# Patient Record
Sex: Male | Born: 1940 | Race: White | Hispanic: No | Marital: Married | Smoking: Former smoker
Health system: Southern US, Community
[De-identification: ages and names within clinical notes are randomized; demographics above are authoritative.]

## PROBLEM LIST (undated history)

## (undated) DIAGNOSIS — J449 Chronic obstructive pulmonary disease, unspecified: Secondary | ICD-10-CM

## (undated) DIAGNOSIS — I739 Peripheral vascular disease, unspecified: Secondary | ICD-10-CM

## (undated) DIAGNOSIS — K589 Irritable bowel syndrome without diarrhea: Secondary | ICD-10-CM

## (undated) DIAGNOSIS — I10 Essential (primary) hypertension: Secondary | ICD-10-CM

## (undated) DIAGNOSIS — E669 Obesity, unspecified: Secondary | ICD-10-CM

## (undated) DIAGNOSIS — E538 Deficiency of other specified B group vitamins: Secondary | ICD-10-CM

## (undated) DIAGNOSIS — R911 Solitary pulmonary nodule: Secondary | ICD-10-CM

## (undated) DIAGNOSIS — I5022 Chronic systolic (congestive) heart failure: Secondary | ICD-10-CM

## (undated) DIAGNOSIS — I119 Hypertensive heart disease without heart failure: Secondary | ICD-10-CM

## (undated) DIAGNOSIS — M1990 Unspecified osteoarthritis, unspecified site: Secondary | ICD-10-CM

## (undated) DIAGNOSIS — E7800 Pure hypercholesterolemia, unspecified: Secondary | ICD-10-CM

## (undated) DIAGNOSIS — K5190 Ulcerative colitis, unspecified, without complications: Secondary | ICD-10-CM

## (undated) DIAGNOSIS — E291 Testicular hypofunction: Secondary | ICD-10-CM

## (undated) HISTORY — DX: Deficiency of other specified B group vitamins: E53.8

## (undated) HISTORY — DX: Hypertensive heart disease without heart failure: I11.9

## (undated) HISTORY — DX: Ulcerative colitis, unspecified, without complications: K51.90

## (undated) HISTORY — DX: Chronic obstructive pulmonary disease, unspecified: J44.9

## (undated) HISTORY — DX: Testicular hypofunction: E29.1

## (undated) HISTORY — DX: Pure hypercholesterolemia, unspecified: E78.00

## (undated) HISTORY — DX: Chronic systolic (congestive) heart failure: I50.22

## (undated) HISTORY — DX: Essential (primary) hypertension: I10

## (undated) HISTORY — DX: Unspecified osteoarthritis, unspecified site: M19.90

## (undated) HISTORY — DX: Peripheral vascular disease, unspecified: I73.9

## (undated) HISTORY — DX: Obesity, unspecified: E66.9

## (undated) HISTORY — PX: AORTA - ILIAC ARTERY BYPASS GRAFT: SUR174

## (undated) HISTORY — DX: Solitary pulmonary nodule: R91.1

---

## 2007-08-21 ENCOUNTER — Ambulatory Visit: Payer: Self-pay | Admitting: Vascular Surgery

## 2008-02-13 ENCOUNTER — Ambulatory Visit: Payer: Self-pay | Admitting: Vascular Surgery

## 2008-08-19 ENCOUNTER — Ambulatory Visit: Payer: Self-pay | Admitting: Vascular Surgery

## 2009-02-09 ENCOUNTER — Ambulatory Visit: Payer: Self-pay | Admitting: Vascular Surgery

## 2009-07-21 ENCOUNTER — Ambulatory Visit: Payer: Self-pay | Admitting: Vascular Surgery

## 2009-08-11 ENCOUNTER — Ambulatory Visit (HOSPITAL_COMMUNITY): Admission: RE | Admit: 2009-08-11 | Discharge: 2009-08-11 | Payer: Self-pay | Admitting: Surgery

## 2009-08-11 ENCOUNTER — Ambulatory Visit: Payer: Self-pay | Admitting: Surgery

## 2009-09-08 ENCOUNTER — Ambulatory Visit: Payer: Self-pay | Admitting: Vascular Surgery

## 2010-03-23 ENCOUNTER — Ambulatory Visit: Payer: Self-pay | Admitting: Vascular Surgery

## 2010-08-16 LAB — POCT I-STAT, CHEM 8
BUN: 11 mg/dL (ref 6–23)
Calcium, Ion: 1.15 mmol/L (ref 1.12–1.32)
Chloride: 105 mEq/L (ref 96–112)
Creatinine, Ser: 0.8 mg/dL (ref 0.4–1.5)
Glucose, Bld: 112 mg/dL — ABNORMAL HIGH (ref 70–99)
HCT: 44 % (ref 39.0–52.0)
Hemoglobin: 15 g/dL (ref 13.0–17.0)
Potassium: 4 mEq/L (ref 3.5–5.1)
Sodium: 139 mEq/L (ref 135–145)
TCO2: 28 mmol/L (ref 0–100)

## 2010-09-07 ENCOUNTER — Encounter (INDEPENDENT_AMBULATORY_CARE_PROVIDER_SITE_OTHER): Payer: Medicare Other

## 2010-09-07 ENCOUNTER — Ambulatory Visit (INDEPENDENT_AMBULATORY_CARE_PROVIDER_SITE_OTHER): Payer: Medicare Other | Admitting: Vascular Surgery

## 2010-09-07 DIAGNOSIS — Z48812 Encounter for surgical aftercare following surgery on the circulatory system: Secondary | ICD-10-CM

## 2010-09-07 DIAGNOSIS — I70219 Atherosclerosis of native arteries of extremities with intermittent claudication, unspecified extremity: Secondary | ICD-10-CM

## 2010-09-07 DIAGNOSIS — I739 Peripheral vascular disease, unspecified: Secondary | ICD-10-CM

## 2010-09-07 NOTE — Assessment & Plan Note (Signed)
OFFICE VISIT  ETAI, COPADO A DOB:  1940/09/06                                       09/07/2010 ZOXWR#:[id number]  Patient returns today for continued follow-up regarding his lower extremity occlusive disease.  He had previously undergone bilateral iliac stenting as well as left femoral popliteal bypass grafting.  He has a known right superficial femoral occlusion.  He continues to have stable claudication beginning in the hips and buttock area, extending down into both thighs, left equal right, and occurs at about a half block and prevents him from walking more than 1/2 to 1 block.  He has no rest pain or history of nonhealing ulcers.  He states that his symptoms are tolerable.  CHRONIC MEDICAL PROBLEMS: 1. Hyperlipidemia. 2. Negative for diabetes, coronary artery disease, COPD, or stroke.  FAMILY HISTORY:  Negative for coronary artery disease, diabetes, and stroke.  SOCIAL HISTORY:  He quit smoking 15 years ago at the time of his left femoral-popliteal bypass graft.  Drinks occasional alcohol.  PHYSICAL EXAMINATION:  Blood pressure 176/71, heart rate 63, respirations 22.  General:  He is a well-developed and well-nourished male in no apparent distress, alert and oriented x3.  HEENT:  Normal for age.  EOMs intact.  Lungs:  Clear to auscultation.  No rhonchi or wheezing.  Cardiovascular:  Regular rhythm.  No murmurs.  Carotid pulses are 3+.  No audible bruits.  Abdomen:  Soft, nontender with no masses. Lower extremity exam reveals 3+ femoral and popliteal pulse on the left and a 3+ femoral pulse on the right.  Both feet are well perfused.  Today I ordered lower extremity duplex scan of his left femoral- popliteal graft and ABIs bilaterally, which I reviewed and interpreted. ABI on the right is 0.84.  On the left is 0.96 with biphasic flow bilaterally.  No evidence of stenosis in his femoral-popliteal graft.  I think in general he is doing well.   Will continue to follow him on a regular basis in the vascular lab unless he develops any worsening symptoms.    Quita Skye Hart [place], M.D. Electronically Signed  JDL/MEDQ  D:  09/07/2010  T:  09/07/2010  Job:  [id number]  cc:   Dr. Roney Marion

## 2010-09-14 NOTE — Procedures (Unsigned)
BYPASS GRAFT EVALUATION  INDICATION:  Follow up left bypass graft  HISTORY: Diabetes:  no Cardiac:  no Hypertension:  yes Smoking:  previous Previous Surgery:  Bilateral common iliac artery PTA and stenting 08/11/2009; left femoral popliteal bypass graft and common femoral artery DPA 10/28/1996.  SINGLE LEVEL ARTERIAL EXAM                              RIGHT              LEFT Brachial:                    186                182 Anterior tibial:             148                174 Posterior tibial:            156                179 Peroneal: Ankle/brachial index:        0.84               0.96  PREVIOUS ABI:  Date: 03/23/2010  RIGHT:  0.83  LEFT:  0.87  LOWER EXTREMITY BYPASS GRAFT DUPLEX EXAM:  DUPLEX:  Patent left femoral to popliteal bypass graft with elevated velocities noted at the proximal anastomosis of 234 cm/sec.  Increased velocity noted in the native outflow artery of 228 cm/sec.  IMPRESSION: 1. Biphasic wave forms noted throughout the patent left femoral to     popliteal bypass graft with elevated velocities as described above. 2. Stable right ankle brachial index. 3. Improved left ankle brachial index.  ___________________________________________ Quita Skye Hart [place], M.D.  EM/MEDQ  D:  09/08/2010  T:  09/08/2010  Job:  [id number]

## 2010-09-14 NOTE — Procedures (Unsigned)
AORTA-ILIAC DUPLEX EVALUATION  INDICATION:  Followup common iliac artery stenting  HISTORY: Diabetes:  no Cardiac:  no Hypertension:  yes Smoking:  Previous Previous Surgery:  Bilateral common iliac PTA and stenting done 08/11/2009              SINGLE LEVEL ARTERIAL EXAM                             RIGHT                  LEFT Brachial:                  186                    182 Anterior tibial:           148                    174 Posterior tibial:          156                    179 Peroneal: Ankle/brachial index:      0.84                   0.96 Previous ABI/date:         03/23/2010, 0.83       0.87  AORTA-ILIAC DUPLEX EXAM Aorta - Proximal     Not visualized due to bowel gas Aorta - Mid          Not visualized due to bowel gas Aorta - Distal       Not visualized due to bowel gas  RIGHT                                   LEFT 168 cm/s          CIA-PROXIMAL          Not visualized 170 cm/s          CIA-DISTAL            142 cm/s Not visualized    HYPOGASTRIC           Not visualized 134 cm/s          EIA-PROXIMAL          172 cm/s 146 cm/s          EIA-MID               161 cm/s Not visualized    EIA-DISTAL            158 cm/s  IMPRESSION: 1. Patent bilateral common iliac arteries. 2. Stable right ankle brachial indice. 3. Improved left ankle brachial index.  ___________________________________________ Quita Skye Hart [place], M.D.  EM/MEDQ  D:  09/08/2010  T:  09/08/2010  Job:  [id number]

## 2010-10-05 NOTE — Procedures (Signed)
BYPASS GRAFT EVALUATION   INDICATION:  Lower extremity bypass graft evaluation.   HISTORY:  Diabetes:  No  Cardiac:  No  Hypertension:  Yes  Smoking:  Quit in 1998.  Previous Surgery:  Left femoral popliteal bypass graft with nonreversed  saphenous vein and Dacron patch angioplasty of the left iliac and left  common femoral artery on 10/28/1996 by Dr. Hart [place].   SINGLE LEVEL ARTERIAL EXAM                               RIGHT              LEFT  Brachial:                    130                133  Anterior tibial:             82                 94  Posterior tibial:            99                 95  Peroneal:  Ankle/brachial index:        0.74               0.71   PREVIOUS ABI:  Date: 02/13/2008  RIGHT:  0.77  LEFT:  0.81   LOWER EXTREMITY BYPASS GRAFT DUPLEX EXAM:    Patent left femoral popliteal bypass graft with no evidence of  recurrent stenosis.   DUPLEX:  Biphasic Duplex wave form noted within graft and native  arteries.   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   ___________________________________________  Quita Skye. Hart [place], M.D.   AC/MEDQ  D:  08/19/2008  T:  08/20/2008  Job:  [id number]

## 2010-10-05 NOTE — Consult Note (Signed)
NEW PATIENT CONSULTATION   Derek Derek [name], Derek Derek [name]  DOB:  1940/12/10                                       07/21/2009  NWGNF#:[id number]   The patient is Derek [name] 70 year old male patient, known to me in the past,  having previously performed Derek [name] left femoral-popliteal bypass graft on him  in 1998 for severe claudication of the left leg.  He has done well over  the years and has been followed in the noninvasive vascular lab with  continued satisfactory ABIs in the 0.8 range bilaterally over the years.  He now reports that over the last several months he has developed  somewhat debilitating claudication in both lower extremities which  begins in both hips and extends into the thigh and calf areas.  He is  able to walk about 50-100 yards but he must then stop and rest, at which  point the symptoms are relieved.  He has no symptoms at rest and denies  any numbness in the feet or history of non-healing ulcers or infection.   CHRONIC MEDICAL PROBLEMS:  1. Hypotension.  2. Hyperlipidemia.  3. Negative for diabetes, coronary artery disease, COPD or stroke.   FAMILY HISTORY:  Negative for coronary artery disease, diabetes and  stroke.   SOCIAL HISTORY:  Married, has 4 children, is retired.  Quit smoking 14  years ago at the time of his femoral popliteal bypass.  Drinks  occasional alcohol.   REVIEW OF SYSTEMS:  Denies any weight gain, anorexia, chest pain,  dyspnea on exertion, PND, orthopnea, chronic bronchitis, wheezing.  No  GI or GU symptoms.  Does have lower extremity symptoms as noted in the  present illness.  No TIAs, amaurosis fugax, musculoskeletal symptoms.  All other systems are negative on review of systems.   PHYSICAL EXAMINATION:  Blood pressure 149/82, heart rate 69, temperature  98.  General:  He is Derek [name] well-developed, well-nourished male in no  apparent distress, appears to be younger than his stated age.  HEENT:  Exam is unremarkable.  EOMs intact.  Conjunctivae  normal.  Chest:  Clear  to auscultation.  No wheezing.  Cardiovascular:  Regular rhythm.  No  murmurs.  Carotid pulses 3+, no bruits.  Abdomen:  Soft, nontender with  no masses.  Musculoskeletal:  Free of major deformities.  Neurologic:  Normal with no weakness.  Skin:  Free of rashes.  Lower extremity exam  reveals 1-2+ femoral pulses bilaterally with popliteal and dorsalis  pedis pulses in both legs at 2+.  Both feet are well-perfused with no  infection or ulceration.  No evidence of venous disease.   Today, I ordered Derek [name] venous duplex exam and scan of the left lower  extremity bypass which I have reviewed and interpreted.  ABI is 0.83 on  the left and 0.78 on the right, essentially unchanged from previous  studies.  There is no evidence of increased velocity along the patent  left femoral-popliteal graft.   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  Will plan on aortobifemoral angiogram with  bilateral runoff by Dr. Myra Derek [name] on March 22 via the right femoral  approach to see if any lesions are amenable to angioplasty or stenting,  if this is something that would require  __________.     Derek Derek [name], M.D.  Electronically Signed   JDL/MEDQ  D:  07/21/2009  T:  07/22/2009  Job:  [id number]

## 2010-10-05 NOTE — Assessment & Plan Note (Signed)
OFFICE VISIT   Derek [name], VANDEGRIFT A  DOB:  05/09/1941                                       09/08/2009  ZOXWR#:[id number]   The patient returns for initial followup regarding the bilateral iliac  PTA and stenting procedure performed by Dr. Myra Gianotti March 22.  He was  having bilateral lower extremity claudication symptoms and was found to  have some occlusive disease at the origin of both common iliacs.  He has  a patent left femoral-popliteal bypass and an occluded right superficial  femoral artery.  He states that his walking has improved with less  burning and aching discomfort in the hips and thighs but he continues to  have a limit of about 500 to 1000 yards before he develops symptoms and  must stop and rest because of some discomfort in both calves.  Interestingly his right and left calf are equal in symptomatology.  He  denies any chest pain, dyspnea on exertion, chronic bronchitis,  hemoptysis, wheezing or other pulmonary issues.   Vital signs:  On exam today his blood pressure is 132/86, heart rate 65,  temperature 98.  General:  He is alert and oriented x3, well-developed  and well-nourished.  HEENT:  Exam is normal.  Chest:  Clear to  auscultation.  Abdomen:  Soft, nontender with no masses.  Lower  extremity exam reveals 3+ femoral pulses bilaterally, 2+ popliteal on  the left.  No popliteal on the right.  Both feet are well-perfused.   Today I ordered a lower extremity arterial study which I reviewed and  interpreted.  ABI is 0.77 on the right and the left is 0.92, slightly  improved from previous studies.   I reassured him regarding these findings and will continue to follow him  on the protocol on a regular basis unless his symptoms worsen.  We could  always consider right femoral-popliteal bypass grafting in the future if  his symptoms become more severe on the right side but at the present  time no indication to proceed with that.     Quita Skye Hart [place], M.D.  Electronically Signed   JDL/MEDQ  D:  09/08/2009  T:  09/09/2009  Job:  [id number]

## 2010-10-05 NOTE — Procedures (Signed)
BYPASS GRAFT EVALUATION   INDICATION:  Followup of a bypass graft.   HISTORY:  Diabetes:  no  Cardiac:  no  Hypertension:  yes  Smoking:  previous  Previous Surgery:  Left femoral to popliteal bypass graft, left iliac  and common femoral artery  DPA patch 10/28/1996.   SINGLE LEVEL ARTERIAL EXAM                               RIGHT              LEFT  Brachial:                    167                162  Anterior tibial:             104                133  Posterior tibial:            130                139  Peroneal:  Ankle/brachial index:        0.78               0.83   PREVIOUS ABI:  Date: 02/09/2009  RIGHT:  0.76  LEFT:  0.88   LOWER EXTREMITY BYPASS GRAFT DUPLEX EXAM:   DUPLEX:  Triphasic and biphasic Doppler waveforms throughout bypass  graft to native arteries   IMPRESSION:  1. Patent bypass graft.  No evidence of stenosis.  2. Ankle brachial indices remain stable from previous study.   ___________________________________________  Quita Skye Hart [place], M.D.   CJ/MEDQ  D:  07/21/2009  T:  07/21/2009  Job:  [id number]

## 2010-10-05 NOTE — Procedures (Signed)
BYPASS GRAFT EVALUATION   INDICATION:  Follow-up evaluation of lower extremity bypass graft.  Patient complains of bilateral claudication when walking up hill, left  worse than right.   HISTORY:  Diabetes:  No.  Cardiac:  No.  Hypertension:  Yes.  Smoking:  Quit in 1998.  Previous Surgery:  Left fem-pop bypass graft with nonreversed saphenous  vein and Dacron patch angioplasty of the left external iliac and left  common femoral artery on 10/28/96 by Dr. Hart [place].   SINGLE LEVEL ARTERIAL EXAM                               RIGHT              LEFT  Brachial:                    168                158  Anterior tibial:             98                 132  Posterior tibial:            128                136  Peroneal:  Ankle/brachial index:        0.77               0.81   PREVIOUS ABI:  Date: 08/21/07  RIGHT:  0.67  LEFT:  0.82   LOWER EXTREMITY BYPASS GRAFT DUPLEX EXAM:   DUPLEX:  1. Doppler arterial waveforms are biphasic proximal to, within, and      distal to the left fem-pop bypass graft.  2. No evidence of vein graft stenosis.   IMPRESSION:  1. Ankle brachial indices are stable from previous study bilaterally.  2. Patent left femoropopliteal bypass graft.   ___________________________________________  Quita Skye. Hart [place], M.D.   MC/MEDQ  D:  02/13/2008  T:  02/13/2008  Job:  [id number]

## 2010-10-05 NOTE — Procedures (Signed)
BYPASS GRAFT EVALUATION   INDICATION:  Follow up left leg bypass graft.   HISTORY:  Diabetes:  No.  Cardiac:  No.  Hypertension:  Yes.  Smoking:  Quit in 1998.  Previous Surgery:  Left femoral-to-popliteal artery bypass graft with  nonreversed saphenous vein and DPA of external iliac and common femoral  arteries on 10/28/1996 by Dr. Hart [place].   SINGLE LEVEL ARTERIAL EXAM                               RIGHT              LEFT  Brachial:                    158                150  Anterior tibial:             54                 130  Posterior tibial:            106                124  Peroneal:  Ankle/brachial index:        0.67               0.82   PREVIOUS ABI:  Date: 05/17/2006  RIGHT:  0.57  LEFT:  0.71   LOWER EXTREMITY BYPASS GRAFT DUPLEX EXAM:   DUPLEX:  Doppler arterial waveforms are biphasic proximal to, throughout  and distal to the graft.  Velocity is mildly elevated in proximal  anastomosis, distal anastomosis, common femoral and popliteal arteries.  These velocities are stable from previous exam.   IMPRESSION:  1. Patent left femoral-to-popliteal artery bypass graft.  2. ABIs are improved bilaterally.   ___________________________________________  Quita Skye Hart [place], M.D.   DP/MEDQ  D:  08/21/2007  T:  08/21/2007  Job:  [id number]

## 2010-10-05 NOTE — Procedures (Signed)
AORTA-ILIAC DUPLEX EVALUATION   INDICATION:   HISTORY:  Diabetes:  No.  Cardiac:  No.  Hypertension:  Yes.  Smoking:  Previous.  Previous Surgery:  Bilateral iliac PTA and stenting procedure done on  08/11/2009.               SINGLE LEVEL ARTERIAL EXAM                              RIGHT                  LEFT  Brachial:                  153                    151  Anterior tibial:           113                    139  Posterior tibial:          132                    133  Peroneal:  Ankle/brachial index:      0.83                   0.87  Previous ABI/date:         0.77, 09/08/2009       0.92, 09/08/2009   AORTA-ILIAC DUPLEX EXAM  Aorta - Proximal     Not visualized due to overlying bowel gas  Aorta - Mid          Not visualized due to overlying bowel gas  Aorta - Distal       Not visualized due to overlying bowel gas   RIGHT                                   LEFT  144 cm/s          CIA-PROXIMAL          187 cm/s  155 cm/s          CIA-DISTAL            218 cm/s  101 cm/s          HYPOGASTRIC           105 cm/s  158 cm/s          EIA-PROXIMAL          202 cm/s  158 cm/s          EIA-MID               184 cm/s  129 cm/s          EIA-DISTAL            121 cm/s   IMPRESSION:   ___________________________________________  Quita Skye Hart [place], M.D.   OD/MEDQ  D:  03/23/2010  T:  03/24/2010  Job:  [id number]

## 2010-10-05 NOTE — Procedures (Signed)
BYPASS GRAFT EVALUATION   INDICATION:  Left lower extremity bypass graft, with stable  claudication.   HISTORY:  Diabetes:  No.  Cardiac:  No.  Hypertension:  Yes.  Smoking:  Previous.  Previous Surgery:  Left femoral-popliteal artery bypass graft with DPA  of left iliac and left common femoral artery 10/28/1996 by Dr. Hart Rochester.   SINGLE LEVEL ARTERIAL EXAM                               RIGHT              LEFT  Brachial:                    148                143  Anterior tibial:             110                128  Posterior tibial:            112                130  Peroneal:  Ankle/brachial index:        0.76               0.88   PREVIOUS ABI:  Date: 08/19/2008  RIGHT:  0.74  LEFT:  0.71   LOWER EXTREMITY BYPASS GRAFT DUPLEX EXAM:   DUPLEX:  Doppler arterial waveforms appear biphasic proximal to, within  and distal to left lower extremity bypass graft.   IMPRESSION:  1. Patent left femoral-popliteal artery bypass graft.  2. Stable right ankle brachial index.  3. Left ankle brachial index shows increase from previous, correlating      more with study one year ago.   ___________________________________________  Quita Skye. Hart [place], M.D.   AS/MEDQ  D:  02/09/2009  T:  02/09/2009  Job:  [id number]

## 2011-09-08 ENCOUNTER — Encounter (INDEPENDENT_AMBULATORY_CARE_PROVIDER_SITE_OTHER): Payer: Medicare Other | Admitting: *Deleted

## 2011-09-08 ENCOUNTER — Ambulatory Visit (INDEPENDENT_AMBULATORY_CARE_PROVIDER_SITE_OTHER): Payer: Medicare Other | Admitting: *Deleted

## 2011-09-08 ENCOUNTER — Other Ambulatory Visit: Payer: Medicare Other | Admitting: *Deleted

## 2011-09-08 DIAGNOSIS — Z48812 Encounter for surgical aftercare following surgery on the circulatory system: Secondary | ICD-10-CM

## 2011-09-08 DIAGNOSIS — I739 Peripheral vascular disease, unspecified: Secondary | ICD-10-CM

## 2011-10-18 ENCOUNTER — Other Ambulatory Visit: Payer: Self-pay | Admitting: *Deleted

## 2011-10-18 ENCOUNTER — Encounter: Payer: Self-pay | Admitting: Vascular Surgery

## 2011-10-18 DIAGNOSIS — Z48812 Encounter for surgical aftercare following surgery on the circulatory system: Secondary | ICD-10-CM

## 2011-10-18 DIAGNOSIS — I70309 Unspecified atherosclerosis of unspecified type of bypass graft(s) of the extremities, unspecified extremity: Secondary | ICD-10-CM

## 2011-10-19 NOTE — Procedures (Unsigned)
BYPASS GRAFT EVALUATION  INDICATION:  Followup bypass graft.  HISTORY: Diabetes:  No Cardiac:  No Hypertension:  Yes Smoking:  Previously Previous Surgery:  Bilateral common iliac artery PTA and stenting, 08/11/2009; left femoral to popliteal bypass graft and DPA, 10/28/1996  SINGLE LEVEL ARTERIAL EXAM                              RIGHT              LEFT Brachial: Anterior tibial: Posterior tibial: Peroneal: Ankle/brachial index:        0.79               0.88  PREVIOUS ABI:  Date: 09/07/2010  RIGHT:  0.84  LEFT:  0.96  LOWER EXTREMITY BYPASS GRAFT DUPLEX EXAM:  DUPLEX:  Patent left fem-pop bypass graft with elevated velocity of 263 cm/second at the proximal anastomosis.  Increased velocity of 225 cm/second visualized at the distal anastomosis/native artery.  IMPRESSION: 1. Patent left femoral to popliteal bypass graft with velocities as     noted above. 2. Stable right ABI. 3. Stable left ABI in comparison to prior study 03/23/2010.  ___________________________________________ Quita Skye. Hart [place], M.D.  SS/MEDQ  D:  09/08/2011  T:  09/08/2011  Job:  [id number]

## 2012-09-03 ENCOUNTER — Encounter (INDEPENDENT_AMBULATORY_CARE_PROVIDER_SITE_OTHER): Payer: Medicare Other | Admitting: *Deleted

## 2012-09-03 ENCOUNTER — Other Ambulatory Visit: Payer: Medicare Other

## 2012-09-03 DIAGNOSIS — I70309 Unspecified atherosclerosis of unspecified type of bypass graft(s) of the extremities, unspecified extremity: Secondary | ICD-10-CM

## 2012-09-03 DIAGNOSIS — Z48812 Encounter for surgical aftercare following surgery on the circulatory system: Secondary | ICD-10-CM

## 2012-09-04 ENCOUNTER — Other Ambulatory Visit: Payer: Medicare Other

## 2012-09-04 ENCOUNTER — Ambulatory Visit: Payer: Medicare Other | Admitting: Neurosurgery

## 2012-09-12 ENCOUNTER — Encounter: Payer: Self-pay | Admitting: Vascular Surgery

## 2012-09-24 ENCOUNTER — Encounter: Payer: Self-pay | Admitting: Vascular Surgery

## 2012-09-25 ENCOUNTER — Encounter: Payer: Self-pay | Admitting: Vascular Surgery

## 2012-09-25 ENCOUNTER — Ambulatory Visit (INDEPENDENT_AMBULATORY_CARE_PROVIDER_SITE_OTHER): Payer: Medicare Other | Admitting: Vascular Surgery

## 2012-09-25 VITALS — BP 193/95 | HR 75 | Resp 18 | Ht 71.0 in | Wt 209.0 lb

## 2012-09-25 DIAGNOSIS — Z48812 Encounter for surgical aftercare following surgery on the circulatory system: Secondary | ICD-10-CM

## 2012-09-25 DIAGNOSIS — I70219 Atherosclerosis of native arteries of extremities with intermittent claudication, unspecified extremity: Secondary | ICD-10-CM

## 2012-09-25 NOTE — Progress Notes (Signed)
Subjective:     Patient ID: Derek [name], male   DOB: [birth year], 72 y.o.   MRN: [id number]  HPI this 72 year old male returns for continued followup regarding his left femoral popliteal bypass graft performed in 1998 and bilateral iliac stents performed by Dr. Myra Gianotti in 2011. He continues to have bilateral hip claudication after walking 3-4 blocks. He does not have severe calf claudication. His symptoms have not changed much. He denies any rest pain or history of nonhealing ulcers.  Past Medical History  Diagnosis Date  . Hypertension   . Claudication     History  Substance Use Topics  . Smoking status: Former Games developer  . Smokeless tobacco: Not on file  . Alcohol Use: 3.0 oz/week    5 Cans of beer per week    History reviewed. No pertinent family history.  Allergies  Allergen Reactions  . Ibuprofen Diarrhea    Current outpatient prescriptions:aspirin 81 MG tablet, Take 81 mg by mouth daily., Disp: , Rfl: ;  clopidogrel (PLAVIX) 75 MG tablet, , Disp: , Rfl: ;  lisinopril (PRINIVIL,ZESTRIL) 40 MG tablet, , Disp: , Rfl: ;  VYTORIN 10-20 MG per tablet, , Disp: , Rfl:   BP 193/95  Pulse 75  Resp 18  Ht 5\' 11"  (1.803 m)  Wt 209 lb (94.802 kg)  BMI 29.16 kg/m2  Body mass index is 29.16 kg/(m^2).         Review of Systems denies chest pain, dyspnea on exertion, PND, orthopnea, hemoptysis, lateralizing weakness, a fascia. All systems negative     Objective:   Physical Exam blood pressure 193/95 heart rate 75 respirations 18 Gen.-alert and oriented x3 in no apparent distress HEENT normal for age Lungs no rhonchi or wheezing Cardiovascular regular rhythm no murmurs carotid pulses 3+ palpable no bruits audible Abdomen soft nontender no palpable masses Musculoskeletal free of  major deformities Skin clear -no rashes Neurologic normal Lower extremities 2+ femoral pulses palpable bilaterally. Both feet well perfused. Pedal pulses not easily palpable.  Today I ordered  bilateral ABIs which were unchanged from a year ago with 0.82 on the right and 0.88 on the left. Also order a duplex scan of the left femoral popliteal bypass and iliac stents and there is no evidence of significant stenosis.      Assessment:     Doing well post bilateral iliac stents and 2011 left femoral-popliteal vein graft in 1998 with mild partially limiting hip claudication    Plan:     Return in one year for continued followup. If symptoms worsen and walking distance shortens will arrange for angiography to see if any restenosis and iliac stents has occurred

## 2012-09-25 NOTE — Addendum Note (Signed)
Addended by: Sharee Pimple on: 09/25/2012 09:32 AM   Modules accepted: Orders

## 2013-07-15 LAB — PULMONARY FUNCTION TEST

## 2013-09-24 ENCOUNTER — Encounter (HOSPITAL_COMMUNITY): Payer: Medicare Other

## 2013-09-24 ENCOUNTER — Ambulatory Visit: Payer: Medicare Other | Admitting: Vascular Surgery

## 2013-09-24 ENCOUNTER — Other Ambulatory Visit (HOSPITAL_COMMUNITY): Payer: Medicare Other

## 2013-10-21 ENCOUNTER — Encounter: Payer: Self-pay | Admitting: Critical Care Medicine

## 2013-10-22 ENCOUNTER — Encounter: Payer: Self-pay | Admitting: Critical Care Medicine

## 2013-10-22 ENCOUNTER — Ambulatory Visit (INDEPENDENT_AMBULATORY_CARE_PROVIDER_SITE_OTHER): Payer: Self-pay | Admitting: Critical Care Medicine

## 2013-10-22 VITALS — BP 126/72 | HR 84 | Temp 98.6°F | Ht 70.5 in | Wt 213.0 lb

## 2013-10-22 DIAGNOSIS — I739 Peripheral vascular disease, unspecified: Secondary | ICD-10-CM | POA: Insufficient documentation

## 2013-10-22 DIAGNOSIS — I10 Essential (primary) hypertension: Secondary | ICD-10-CM | POA: Insufficient documentation

## 2013-10-22 DIAGNOSIS — E7800 Pure hypercholesterolemia, unspecified: Secondary | ICD-10-CM | POA: Insufficient documentation

## 2013-10-22 DIAGNOSIS — R911 Solitary pulmonary nodule: Secondary | ICD-10-CM | POA: Insufficient documentation

## 2013-10-22 DIAGNOSIS — J4489 Other specified chronic obstructive pulmonary disease: Secondary | ICD-10-CM

## 2013-10-22 DIAGNOSIS — M1990 Unspecified osteoarthritis, unspecified site: Secondary | ICD-10-CM | POA: Insufficient documentation

## 2013-10-22 DIAGNOSIS — J449 Chronic obstructive pulmonary disease, unspecified: Secondary | ICD-10-CM

## 2013-10-22 MED ORDER — FLUTICASONE-SALMETEROL 250-50 MCG/DOSE IN AEPB: 1.0000 | INHALATION_SPRAY | Freq: Two times a day (BID) | RESPIRATORY_TRACT | Status: DC

## 2013-10-22 NOTE — Progress Notes (Signed)
Subjective:    Patient ID: Derek [name], male    DOB: Mar 15, 1941, 73 y.o.   MRN: [id number]  HPI Comments: Pt in hosp 3 mo ago with PNA. And ? Nodule R lung.  CT scan done .  Now has PAD, prior surgery.  Not able to walk 248ft and legs cramp and will get dyspneic  Shortness of Breath This is a chronic problem. The current episode started more than 1 year ago. The problem occurs daily (worse with an incline ). The problem has been unchanged. Associated symptoms include leg pain. Pertinent negatives include no abdominal pain, chest pain, claudication, fever, headaches, hemoptysis, leg swelling, neck pain, orthopnea, PND, rhinorrhea, sore throat, sputum production, swollen glands, syncope, vomiting or wheezing. Risk factors include smoking. Treatments tried: advair was Rx. uses as needed, ? if helps. The treatment provided no relief. His past medical history is significant for COPD and pneumonia. There is no history of asthma, bronchiolitis, CAD, DVT, a heart failure or PE.   Past Medical History  Diagnosis Date  . Hypertension   . Claudication   . Pulmonary nodule   . COPD (chronic obstructive pulmonary disease)   . Degenerative arthritis   . Obese   . PAD (peripheral artery disease)   . Pure hypercholesterolemia      Family History  Problem Relation Age of Onset  . Dementia Father   . Dementia Mother      History   Social History  . Marital Status: Married    Spouse Name: N/A    Number of Children: N/A  . Years of Education: N/A   Occupational History  . Retired    Social History Main Topics  . Smoking status: Former Smoker -- 2.50 packs/day for 46 years    Types: Cigarettes    Quit date: 05/23/1996  . Smokeless tobacco: Never Used  . Alcohol Use: 1.8 oz/week    3 Cans of beer per week  . Drug Use: No  . Sexual Activity: Not on file   Other Topics Concern  . Not on file   Social History Narrative  . No narrative on file     Allergies  Allergen Reactions  .  Ibuprofen Diarrhea     Outpatient Prescriptions Prior to Visit  Medication Sig Dispense Refill  . aspirin 81 MG tablet Take 81 mg by mouth daily.      . clopidogrel (PLAVIX) 75 MG tablet Take 75 mg by mouth daily.       Marland Kitchen VYTORIN 10-20 MG per tablet Take 1 tablet by mouth at bedtime.       Marland Kitchen lisinopril (PRINIVIL,ZESTRIL) 40 MG tablet        No facility-administered medications prior to visit.   Review of Systems  Constitutional: Negative for fever.  HENT: Negative for rhinorrhea, sinus pressure, sneezing, sore throat, trouble swallowing and voice change.   Respiratory: Positive for chest tightness and shortness of breath. Negative for cough, hemoptysis, sputum production, choking, wheezing and stridor.   Cardiovascular: Negative for chest pain, orthopnea, claudication, leg swelling, syncope and PND.  Gastrointestinal: Negative for vomiting and abdominal pain.       No gerd  Musculoskeletal: Negative for neck pain.  Neurological: Negative for headaches.       Objective:   Physical Exam Filed Vitals:   10/22/13 1528  BP: 126/72  Pulse: 84  Temp: 98.6 F (37 C)  TempSrc: Oral  Height: 5' 10.5" (1.791 m)  Weight: 213 lb (96.616 kg)  SpO2: 92%    Gen: Pleasant, well-nourished, in no distress,  normal affect  ENT: No lesions,  mouth clear,  oropharynx clear, no postnasal drip  Neck: No JVD, no TMG, no carotid bruits  Lungs: No use of accessory muscles, no dullness to percussion, distant bs  Cardiovascular: RRR, heart sounds normal, no murmur or gallops, no peripheral edema  Abdomen: soft and NT, no HSM,  BS normal  Musculoskeletal: No deformities, no cyanosis or clubbing  Neuro: alert, non focal  Skin: Warm, no lesions or rashes  No results found.   Cxr: copd changes Labs from [facility]: cmet, cbc OK     Assessment & Plan:   COPD (chronic obstructive pulmonary disease) Copd stable at present. Need old records  Plan Try to use Advair every day , one puff  twice daily We will obtain records from Dr Blenda Nicelyhodri Work on weight loss. Return 3 months    Updated Medication List Outpatient Encounter Prescriptions as of 10/22/2013  Medication Sig  . amLODipine (NORVASC) 5 MG tablet Take 1 tablet by mouth daily.  Marland Kitchen. aspirin 81 MG tablet Take 81 mg by mouth daily.  . clopidogrel (PLAVIX) 75 MG tablet Take 75 mg by mouth daily.   . Cyanocobalamin (VITAMIN B-12 IJ) Inject as directed every 30 (thirty) days.  . Fluticasone-Salmeterol (ADVAIR DISKUS) 250-50 MCG/DOSE AEPB Inhale 1 puff into the lungs 2 (two) times daily.  . hydrochlorothiazide (HYDRODIURIL) 12.5 MG tablet Take 1 tablet by mouth daily.  . Multiple Vitamin (MULTIVITAMIN) tablet Take 1 tablet by mouth daily.  Marland Kitchen. PROCTOSOL HC 2.5 % rectal cream as needed.  . Triamcinolone Acetonide 0.025 % LOTN as needed.  Marland Kitchen. VYTORIN 10-20 MG per tablet Take 1 tablet by mouth at bedtime.   . [DISCONTINUED] ADVAIR DISKUS 250-50 MCG/DOSE AEPB Inhale 1 puff into the lungs daily.  . [DISCONTINUED] lisinopril (PRINIVIL,ZESTRIL) 40 MG tablet

## 2013-10-22 NOTE — Patient Instructions (Signed)
Try to use Advair every day , one puff twice daily We will obtain records from Dr Blenda Nicely Work on weight loss. Return 3 months

## 2013-10-23 NOTE — Assessment & Plan Note (Signed)
Copd stable at present. Need old records  Plan Try to use Advair every day , one puff twice daily We will obtain records from Dr Blenda Nicely Work on weight loss. Return 3 months

## 2013-10-24 ENCOUNTER — Telehealth: Payer: Self-pay | Admitting: Critical Care Medicine

## 2013-10-24 NOTE — Telephone Encounter (Signed)
Rec'd from [facility] forward 9 pages to Dr.Wright

## 2013-10-25 ENCOUNTER — Encounter: Payer: Self-pay | Admitting: Critical Care Medicine

## 2013-10-29 ENCOUNTER — Other Ambulatory Visit (HOSPITAL_COMMUNITY): Payer: Medicare Other

## 2013-10-29 ENCOUNTER — Encounter (HOSPITAL_COMMUNITY): Payer: Medicare Other

## 2013-10-29 ENCOUNTER — Ambulatory Visit: Payer: Medicare Other | Admitting: Vascular Surgery

## 2013-11-13 ENCOUNTER — Telehealth: Payer: Self-pay | Admitting: Critical Care Medicine

## 2013-11-13 NOTE — Telephone Encounter (Signed)
Received 13 pages from [facility], sent to Dr. Delford Field on 11/13/13/ss.

## 2014-02-03 ENCOUNTER — Encounter: Payer: Self-pay | Admitting: Vascular Surgery

## 2014-02-04 ENCOUNTER — Ambulatory Visit (INDEPENDENT_AMBULATORY_CARE_PROVIDER_SITE_OTHER): Payer: Medicare Other | Admitting: Vascular Surgery

## 2014-02-04 ENCOUNTER — Encounter: Payer: Self-pay | Admitting: Vascular Surgery

## 2014-02-04 ENCOUNTER — Ambulatory Visit (INDEPENDENT_AMBULATORY_CARE_PROVIDER_SITE_OTHER)
Admission: RE | Admit: 2014-02-04 | Discharge: 2014-02-04 | Disposition: A | Discharge status: Home / Self Care | Payer: Medicare Other | Source: Ambulatory Visit | Attending: Vascular Surgery | Admitting: Vascular Surgery

## 2014-02-04 ENCOUNTER — Ambulatory Visit (HOSPITAL_COMMUNITY)
Admission: RE | Admit: 2014-02-04 | Discharge: 2014-02-04 | Disposition: A | Discharge status: Home / Self Care | Payer: Medicare Other | Source: Ambulatory Visit | Attending: Vascular Surgery | Admitting: Vascular Surgery

## 2014-02-04 VITALS — BP 141/91 | HR 98 | Resp 16 | Ht 70.0 in | Wt 206.0 lb

## 2014-02-04 DIAGNOSIS — I70219 Atherosclerosis of native arteries of extremities with intermittent claudication, unspecified extremity: Secondary | ICD-10-CM

## 2014-02-04 DIAGNOSIS — Z48812 Encounter for surgical aftercare following surgery on the circulatory system: Secondary | ICD-10-CM | POA: Diagnosis not present

## 2014-02-04 DIAGNOSIS — I739 Peripheral vascular disease, unspecified: Secondary | ICD-10-CM

## 2014-02-04 HISTORY — DX: Atherosclerosis of native arteries of extremities with intermittent claudication, unspecified extremity: I70.219

## 2014-02-04 NOTE — Progress Notes (Signed)
Subjective:     Patient ID: Derek [name], male   DOB: May 29, 1940, 73 y.o.   MRN: [id number]  HPI this 73 year old male returns for continued followup regarding his bilateral iliac stents placed by Dr. Myra Gianotti in 2011 and his left femoral-popliteal bypass graft-saphenous vein performed by me in 1998. He does have some bilateral buttock and thigh claudication symptoms but is able to ambulate long distances and this is not limiting him. His symptoms are equal in the right and left legs. He takes one aspirin per day.  Past Medical History  Diagnosis Date  . Hypertension   . Claudication   . Pulmonary nodule   . COPD (chronic obstructive pulmonary disease)   . Degenerative arthritis   . Obese   . PAD (peripheral artery disease)   . Pure hypercholesterolemia     History  Substance Use Topics  . Smoking status: Former Smoker -- 2.50 packs/day for 46 years    Types: Cigarettes    Quit date: 05/23/1996  . Smokeless tobacco: Never Used  . Alcohol Use: 1.8 oz/week    3 Cans of beer per week    Family History  Problem Relation Age of Onset  . Dementia Father   . Dementia Mother     Allergies  Allergen Reactions  . Ibuprofen Diarrhea    Current outpatient prescriptions:aspirin 81 MG tablet, Take 81 mg by mouth daily., Disp: , Rfl: ;  clopidogrel (PLAVIX) 75 MG tablet, Take 75 mg by mouth daily. , Disp: , Rfl: ;  Cyanocobalamin (VITAMIN B-12 IJ), Inject as directed every 30 (thirty) days., Disp: , Rfl: ;  Fluticasone-Salmeterol (ADVAIR DISKUS) 250-50 MCG/DOSE AEPB, Inhale 1 puff into the lungs 2 (two) times daily., Disp: 60 each, Rfl:  hydrochlorothiazide (HYDRODIURIL) 12.5 MG tablet, Take 1 tablet by mouth daily., Disp: , Rfl: ;  Multiple Vitamin (MULTIVITAMIN) tablet, Take 1 tablet by mouth daily., Disp: , Rfl: ;  PROCTOSOL HC 2.5 % rectal cream, as needed., Disp: , Rfl: ;  amLODipine (NORVASC) 5 MG tablet, Take 1 tablet by mouth daily., Disp: , Rfl: ;  Triamcinolone Acetonide 0.025 %  LOTN, as needed., Disp: , Rfl:  VYTORIN 10-20 MG per tablet, Take 1 tablet by mouth at bedtime. , Disp: , Rfl:   BP 141/91  Pulse 98  Resp 16  Ht  (1.778 m)  Wt 206 lb (93.441 kg)  BMI 29.56 kg/m2  Body mass index is 29.56 kg/(m^2).          Review of Systems does have dyspnea on exertion and leg pain with walking as well as some weakness in the arms and legs. Denies chest pain, orthopnea, hemoptysis, lateralizing weakness, a fascia. All other systems negative and a complete review of systems    Objective:   Physical Exam BP 141/91  Pulse 98  Resp 16  Ht  (1.778 m)  Wt 206 lb (93.441 kg)  BMI 29.56 kg/m2  Gen.-alert and oriented x3 in no apparent distress HEENT normal for age Lungs no rhonchi or wheezing Cardiovascular regular rhythm no murmurs carotid pulses 3+ palpable no bruits audible Abdomen soft nontender no palpable masses Musculoskeletal free of  major deformities Skin clear -no rashes Neurologic normal Lower extremities 2+ femoral and dorsalis pedis pulses palpable on the left.-2+ right femoral pulse with absent distal pulses. Both feet adequately perfused.  Today ordered duplex scan of both lower extremities and ABIs. Right leg has an ABI of 0.77 and the posterior tibial left leg has 0.89.  There is monophasic flow bilaterally. The right superficial femoral artery is occluded and the left femoral-popliteal vein graft is widely patent.       Assessment:     Patent bilateral iliac stents and left femoral-popliteal vein graft with mild bilateral hip claudication    Plan:     Return in one year for repeat duplex scan and ABIs. If symptoms worsen patient could undergo angiography. He may have developed some in-stent stenoses which would be amenable to balloon angioplasty. Symptoms are not limiting him at the present time.

## 2014-02-05 NOTE — Addendum Note (Signed)
Addended by: Sharee Pimple on: 02/05/2014 11:32 AM   Modules accepted: Orders

## 2014-03-11 ENCOUNTER — Ambulatory Visit (INDEPENDENT_AMBULATORY_CARE_PROVIDER_SITE_OTHER): Payer: Medicare Other | Admitting: Critical Care Medicine

## 2014-03-11 ENCOUNTER — Encounter: Payer: Self-pay | Admitting: Critical Care Medicine

## 2014-03-11 VITALS — BP 140/78 | HR 84 | Temp 97.8°F | Ht 70.0 in | Wt 205.2 lb

## 2014-03-11 DIAGNOSIS — J432 Centrilobular emphysema: Secondary | ICD-10-CM

## 2014-03-11 MED ORDER — FLUTICASONE-SALMETEROL 250-50 MCG/DOSE IN AEPB: 1.0000 | INHALATION_SPRAY | Freq: Two times a day (BID) | RESPIRATORY_TRACT | Status: DC

## 2014-03-11 NOTE — Assessment & Plan Note (Signed)
Copd Gold B stable at present Plan No change in inhaled or maintenance medications. Return in  4 months

## 2014-03-11 NOTE — Patient Instructions (Signed)
No change in medications. Return in         4 months 

## 2014-03-11 NOTE — Progress Notes (Signed)
Subjective:    Patient ID: Derek [name], male    DOB: March 22, 1941, 73 y.o.   MRN: [id number]  HPI Comments: Pt in hosp 3 mo ago with PNA. And ? Nodule R lung.  CT scan done .  Now has PAD, prior surgery.  Not able to walk 299ft and legs cramp and will get dyspneic  03/11/2014 Chief Complaint  Patient presents with  . Follow-up    Feeling the same-sob same with exertion,no cough,no wheeze,denies cp or tightness  Pt still with claudication up stairs or walk far. No changes on recent visit.  No real cough. Pt notes dyspnea up stairs or walk >154ft. Circulation limits more than dyspnea.  Pt in hosp 11/2013 for PNA.  Colonized with MRSA.  Hosp stay at [facility].  Pt denies any significant sore throat, nasal congestion or excess secretions, fever, chills, sweats, unintended weight loss, pleurtic or exertional chest pain, orthopnea PND, or leg swelling Pt denies any increase in rescue therapy over baseline, denies waking up needing it or having any early am or nocturnal exacerbations of coughing/wheezing/or dyspnea. Pt also denies any obvious fluctuation in symptoms with  weather or environmental change or other alleviating or aggravating factors    Review of Systems  HENT: Negative for sinus pressure, sneezing, trouble swallowing and voice change.   Respiratory: Positive for chest tightness. Negative for cough, choking and stridor.   Gastrointestinal:       No gerd       Objective:   Physical Exam  Filed Vitals:   03/11/14 1109  BP: 140/78  Pulse: 84  Temp: 97.8 F (36.6 C)  TempSrc: Oral  Height: 5\' 10"  (1.778 m)  Weight: 93.078 kg (205 lb 3.2 oz)  SpO2: 94%    Gen: Pleasant, well-nourished, in no distress,  normal affect  ENT: No lesions,  mouth clear,  oropharynx clear, no postnasal drip  Neck: No JVD, no TMG, no carotid bruits  Lungs: No use of accessory muscles, no dullness to percussion, distant bs  Cardiovascular: RRR, heart sounds normal, no murmur or gallops, no  peripheral edema  Abdomen: soft and NT, no HSM,  BS normal  Musculoskeletal: No deformities, no cyanosis or clubbing  Neuro: alert, non focal  Skin: Warm, no lesions or rashes  No results found.   Cxr: copd changes Labs from [facility]: cmet, cbc OK     Assessment & Plan:   COPD (chronic obstructive pulmonary disease) Gold B Copd Gold B stable at present Plan No change in inhaled or maintenance medications. Return in  4 months    Updated Medication List Outpatient Encounter Prescriptions as of 03/11/2014  Medication Sig  . amLODipine (NORVASC) 5 MG tablet Take 1 tablet by mouth daily.  Marland Kitchen aspirin 81 MG tablet Take 81 mg by mouth daily.  . clopidogrel (PLAVIX) 75 MG tablet Take 75 mg by mouth daily.   . Cyanocobalamin (VITAMIN B-12 IJ) Inject as directed every 30 (thirty) days.  . Fluticasone-Salmeterol (ADVAIR DISKUS) 250-50 MCG/DOSE AEPB Inhale 1 puff into the lungs 2 (two) times daily.  . hydrochlorothiazide (HYDRODIURIL) 12.5 MG tablet Take 1 tablet by mouth daily.  . Multiple Vitamin (MULTIVITAMIN) tablet Take 1 tablet by mouth daily.  Marland Kitchen PROCTOSOL HC 2.5 % rectal cream as needed.  . Triamcinolone Acetonide 0.025 % LOTN as needed.  . [DISCONTINUED] Fluticasone-Salmeterol (ADVAIR DISKUS) 250-50 MCG/DOSE AEPB Inhale 1 puff into the lungs 2 (two) times daily.  Marland Kitchen dicyclomine (BENTYL) 20 MG tablet As needed  . [  DISCONTINUED] terbinafine (LAMISIL) 250 MG tablet Take 250 mg by mouth daily.  . [DISCONTINUED] VYTORIN 10-20 MG per tablet Take 1 tablet by mouth at bedtime.

## 2014-06-26 DIAGNOSIS — K120 Recurrent oral aphthae: Secondary | ICD-10-CM | POA: Diagnosis not present

## 2014-06-26 DIAGNOSIS — H109 Unspecified conjunctivitis: Secondary | ICD-10-CM | POA: Diagnosis not present

## 2014-06-26 DIAGNOSIS — E538 Deficiency of other specified B group vitamins: Secondary | ICD-10-CM | POA: Diagnosis not present

## 2014-08-07 ENCOUNTER — Telehealth: Payer: Self-pay | Admitting: Critical Care Medicine

## 2014-08-07 DIAGNOSIS — R911 Solitary pulmonary nodule: Secondary | ICD-10-CM

## 2014-08-07 NOTE — Telephone Encounter (Signed)
Pt due for follow up CT Chest with no contrast around August 21, 2014 [email or web address] for pt on home and cell #s to remind him of above and see if he would like to have this done at [facility]. Order will need to be placed after speaking with pt.

## 2014-08-07 NOTE — Telephone Encounter (Signed)
Pt cb for crystal, sched f/u with PW on 3/29, pt wanted to know if he can get billed for his copay this day due to not getting his check until the third and PW next availble not being until 5/31. I called [facility] and asked if pt could be bill and they said they would have to ask PW when he comes back to office. Please cb at previous number listed

## 2014-08-08 NOTE — Telephone Encounter (Signed)
I sent Dahlia Bailiff office Manager, a generic email asking about co-pay question.  Will await response.  lmomtcb for pt on home and cell #s to discuss below regarding CT and let him know I am still working on the co-pay question with [facility] office as they do the billing.

## 2014-08-08 NOTE — Telephone Encounter (Signed)
Spoke with pt.  He would like to proceed with CT Chest - order placed to have scan done at [facility]. Pt aware he will receive a call from [facility] with appt date/time and is aware this will be done next wk as his appt is pending with PW on March 29.  Pt in agreement with this.  Regarding co-pay issue, this has been taken care of with [facility] office and pt aware.  Pt can keep March 29 appt with PW in [place] - pt aware and voiced no further questions or concerns at this time.

## 2014-08-13 DIAGNOSIS — I2510 Atherosclerotic heart disease of native coronary artery without angina pectoris: Secondary | ICD-10-CM | POA: Diagnosis not present

## 2014-08-13 DIAGNOSIS — R911 Solitary pulmonary nodule: Secondary | ICD-10-CM | POA: Diagnosis not present

## 2014-08-13 DIAGNOSIS — R918 Other nonspecific abnormal finding of lung field: Secondary | ICD-10-CM | POA: Diagnosis not present

## 2014-08-13 DIAGNOSIS — I358 Other nonrheumatic aortic valve disorders: Secondary | ICD-10-CM | POA: Diagnosis not present

## 2014-08-17 ENCOUNTER — Telehealth: Payer: Self-pay | Admitting: Critical Care Medicine

## 2014-08-17 DIAGNOSIS — R911 Solitary pulmonary nodule: Secondary | ICD-10-CM

## 2014-08-17 NOTE — Telephone Encounter (Signed)
Let pt know CT Chest is stable and all lung nodules are benign and unchanged since 2014.     No further CT scans needed

## 2014-08-18 NOTE — Telephone Encounter (Signed)
Pt returning Crystal's call - [phone]

## 2014-08-18 NOTE — Telephone Encounter (Signed)
Called and spoke to pt. Informed pt of the results and recs per PW. Pt verbalized understanding and denied any further questions or concerns at this time.

## 2014-08-18 NOTE — Telephone Encounter (Signed)
lmomtcb for pt 

## 2014-08-19 ENCOUNTER — Encounter: Payer: Self-pay | Admitting: Critical Care Medicine

## 2014-08-19 ENCOUNTER — Ambulatory Visit (INDEPENDENT_AMBULATORY_CARE_PROVIDER_SITE_OTHER): Payer: Medicaid Other | Admitting: Critical Care Medicine

## 2014-08-19 VITALS — BP 148/68 | HR 78 | Temp 97.1°F | Ht 70.0 in | Wt 209.6 lb

## 2014-08-19 DIAGNOSIS — J449 Chronic obstructive pulmonary disease, unspecified: Secondary | ICD-10-CM | POA: Diagnosis not present

## 2014-08-19 DIAGNOSIS — J432 Centrilobular emphysema: Secondary | ICD-10-CM | POA: Diagnosis not present

## 2014-08-19 NOTE — Patient Instructions (Signed)
No change in medications. Return in          6 months I will alert Dr redding to our conversation about you heart evaluation

## 2014-08-19 NOTE — Progress Notes (Signed)
   Subjective:    Patient ID: Derek [name], male    DOB: 02/08/1941, 74 y.o.   MRN: [id number]  HPI Comments: Pt in hosp 3 mo ago with PNA. And ? Nodule R lung.  CT scan done .  Now has PAD, prior surgery.  Not able to walk 262ft and legs cramp and will get dyspneic  08/19/2014 Chief Complaint  Patient presents with  . Follow-up    Had CT aware of results.Sob with exertion after about 100 yds.,no wheeze or cough,denies cp or tightness  F/u copd , on Advair.   CT Chest : Nodule unchanged.  Likely benign.  No further scans needed.    Pt still gets dyspneic with exertion. Pt with PAD.   Pt denies any significant sore throat, nasal congestion or excess secretions, fever, chills, sweats, unintended weight loss, pleurtic or exertional chest pain, orthopnea PND, or leg swelling Pt denies any increase in rescue therapy over baseline, denies waking up needing it or having any early am or nocturnal exacerbations of coughing/wheezing/or dyspnea. Pt also denies any obvious fluctuation in symptoms with  weather or environmental change or other alleviating or aggravating factors     Review of Systems  HENT: Negative for sinus pressure, sneezing, trouble swallowing and voice change.   Respiratory: Positive for chest tightness. Negative for cough, choking and stridor.   Gastrointestinal:       No gerd       Objective:   Physical Exam Filed Vitals:   08/19/14 1139  BP: 148/68  Pulse: 78  Temp: 97.1 F (36.2 C)  TempSrc: Oral  Height: 5\' 10"  (1.778 m)  Weight: 209 lb 9.6 oz (95.074 kg)  SpO2: 92%    Gen: Pleasant, well-nourished, in no distress,  normal affect  ENT: No lesions,  mouth clear,  oropharynx clear, no postnasal drip  Neck: No JVD, no TMG, no carotid bruits  Lungs: No use of accessory muscles, no dullness to percussion, distant bs  Cardiovascular: RRR, heart sounds normal, no murmur or gallops, no peripheral edema  Abdomen: soft and NT, no HSM,  BS  normal  Musculoskeletal: No deformities, no cyanosis or clubbing  Neuro: alert, non focal  Skin: Warm, no lesions or rashes  No results found.     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 amLODipine (NORVASC) 5 MG tablet Take 1 tablet by mouth daily.  Marland Kitchen aspirin 81 MG tablet Take 81 mg by mouth daily.  . clopidogrel (PLAVIX) 75 MG tablet Take 75 mg by mouth daily.   . Cyanocobalamin (VITAMIN B-12 IJ) Inject as directed every 30 (thirty) days.  . Fluticasone-Salmeterol (ADVAIR DISKUS) 250-50 MCG/DOSE AEPB Inhale 1 puff into the lungs 2 (two) times daily.  . hydrochlorothiazide (HYDRODIURIL) 12.5 MG tablet Take 1 tablet by mouth daily.  . Multiple Vitamin (MULTIVITAMIN) tablet Take 1 tablet by mouth daily.  Marland Kitchen PROCTOSOL HC 2.5 % rectal cream as needed.  . Triamcinolone Acetonide 0.025 % LOTN as needed.  . dicyclomine (BENTYL) 20 MG tablet As needed

## 2014-08-19 NOTE — Assessment & Plan Note (Signed)
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

## 2014-09-03 ENCOUNTER — Encounter: Payer: Self-pay | Admitting: Critical Care Medicine

## 2014-10-13 DIAGNOSIS — E538 Deficiency of other specified B group vitamins: Secondary | ICD-10-CM | POA: Diagnosis not present

## 2015-01-27 DIAGNOSIS — D510 Vitamin B12 deficiency anemia due to intrinsic factor deficiency: Secondary | ICD-10-CM | POA: Diagnosis not present

## 2015-02-03 DIAGNOSIS — L821 Other seborrheic keratosis: Secondary | ICD-10-CM | POA: Diagnosis not present

## 2015-02-03 DIAGNOSIS — R209 Unspecified disturbances of skin sensation: Secondary | ICD-10-CM | POA: Diagnosis not present

## 2015-02-03 DIAGNOSIS — L570 Actinic keratosis: Secondary | ICD-10-CM | POA: Diagnosis not present

## 2015-02-10 ENCOUNTER — Encounter (HOSPITAL_COMMUNITY): Payer: Medicaid Other

## 2015-02-10 ENCOUNTER — Other Ambulatory Visit (HOSPITAL_COMMUNITY): Payer: Medicaid Other

## 2015-02-10 ENCOUNTER — Ambulatory Visit: Payer: Medicare Other | Admitting: Family

## 2015-03-03 DIAGNOSIS — R05 Cough: Secondary | ICD-10-CM | POA: Diagnosis not present

## 2015-03-03 DIAGNOSIS — R911 Solitary pulmonary nodule: Secondary | ICD-10-CM | POA: Diagnosis not present

## 2015-03-03 DIAGNOSIS — J111 Influenza due to unidentified influenza virus with other respiratory manifestations: Secondary | ICD-10-CM | POA: Diagnosis not present

## 2015-03-03 DIAGNOSIS — R509 Fever, unspecified: Secondary | ICD-10-CM | POA: Diagnosis not present

## 2015-03-12 ENCOUNTER — Ambulatory Visit: Payer: Medicaid Other | Admitting: Internal Medicine

## 2015-03-26 DIAGNOSIS — Z86010 Personal history of colonic polyps: Secondary | ICD-10-CM | POA: Diagnosis not present

## 2015-03-26 DIAGNOSIS — K6289 Other specified diseases of anus and rectum: Secondary | ICD-10-CM | POA: Diagnosis not present

## 2015-03-26 DIAGNOSIS — K625 Hemorrhage of anus and rectum: Secondary | ICD-10-CM | POA: Diagnosis not present

## 2015-03-26 DIAGNOSIS — L290 Pruritus ani: Secondary | ICD-10-CM | POA: Diagnosis not present

## 2015-03-31 ENCOUNTER — Encounter: Payer: Self-pay | Admitting: Internal Medicine

## 2015-03-31 ENCOUNTER — Encounter: Payer: Self-pay | Admitting: *Deleted

## 2015-03-31 ENCOUNTER — Ambulatory Visit (INDEPENDENT_AMBULATORY_CARE_PROVIDER_SITE_OTHER)
Admission: RE | Admit: 2015-03-31 | Discharge: 2015-03-31 | Disposition: A | Discharge status: Home / Self Care | Payer: Medicare Other | Source: Ambulatory Visit | Attending: Internal Medicine | Admitting: Internal Medicine

## 2015-03-31 ENCOUNTER — Ambulatory Visit (INDEPENDENT_AMBULATORY_CARE_PROVIDER_SITE_OTHER): Payer: Medicare Other | Admitting: Internal Medicine

## 2015-03-31 VITALS — BP 138/74 | HR 82 | Ht 70.0 in | Wt 204.2 lb

## 2015-03-31 DIAGNOSIS — J449 Chronic obstructive pulmonary disease, unspecified: Secondary | ICD-10-CM | POA: Diagnosis not present

## 2015-03-31 DIAGNOSIS — R911 Solitary pulmonary nodule: Secondary | ICD-10-CM | POA: Diagnosis not present

## 2015-03-31 DIAGNOSIS — J189 Pneumonia, unspecified organism: Secondary | ICD-10-CM | POA: Diagnosis not present

## 2015-03-31 NOTE — Progress Notes (Signed)
Quick Note:  Spoke with pt and notified of results per Dr. Wert. Pt verbalized understanding and denied any questions.  ______ 

## 2015-03-31 NOTE — Progress Notes (Signed)
Quick Note:  lmtcb ______ 

## 2015-03-31 NOTE — Progress Notes (Signed)
Subjective:  Patient ID: Derek [name], male    DOB: July 18, 1940      MRN: [id number]   Brief patient profile:  18 yowm quit smoking around mid 1990s  followed previously by Dr Delford Field in [place] with copd with GOLD II criteria pfts  06/2013 / pvd    History of Present Illness  08/19/2014 Dr Lynelle Doctor final ov  Chief Complaint  Patient presents with  . Follow-up    Had CT aware of results.Sob with exertion after about 100 yds.,no wheeze or cough,denies cp or tightness  F/u copd , on Advair.   CT Chest : Nodule unchanged.  Likely benign.  No further scans needed.    Pt still gets dyspneic with exertion. Pt with PAD.   rec No change rx      03/31/2015  f/u ov/Koren Plyler re:  maint on advair 250 bid  Chief Complaint  Patient presents with  . Follow-up    Former PW pt. Pt reports breathing as been okay but still gets SOB w/ exertion. NO wheezing, no chest tx. Pt was DX w/ PNA 3 weeks ago. treated with ABX and prednisone   now back to baseline p rx for pna in   = chronic doe x MMRC2 = can't walk a nl pace on a flat grade s sob (no more than a few hundred yards at nl pace, no steps) s hips and leg pain = bilaterally  No obvious day to day or daytime variability or assoc chronic cough or cp or chest tightness, subjective wheeze or overt sinus or hb symptoms. No unusual exp hx or h/o childhood pna/ asthma or knowledge of premature birth.  Sleeping ok without nocturnal  or early am exacerbation  of respiratory  c/o's or need for noct saba. Also denies any obvious fluctuation of symptoms with weather or environmental changes or other aggravating or alleviating factors except as outlined above   Current Medications, Allergies, Complete Past Medical History, Past Surgical History, Family History, and Social History were reviewed in [employer] record.  ROS  The following are not active complaints unless bolded sore throat, dysphagia, dental problems, itching, sneezing,   nasal congestion or excess/ purulent secretions, ear ache,   fever, chills, sweats, unintended wt loss, classically pleuritic or exertional cp, hemoptysis,  orthopnea pnd or leg swelling, presyncope, palpitations, abdominal pain, anorexia, nausea, vomiting, diarrhea  or change in bowel or bladder habits, change in stools or urine, dysuria,hematuria,  rash, arthralgias, visual complaints, headache, numbness, weakness or ataxia or problems with walking or coordination,  change in mood/affect or memory.           Objective:   Physical Exam    Amb wm nad  Wt Readings from Last 3 Encounters:  03/31/15 204 lb 3.2 oz (92.625 kg)  08/19/14 209 lb 9.6 oz (95.074 kg)  03/11/14 205 lb 3.2 oz (93.078 kg)    Vital signs reviewed  Gen: Pleasant, well-nourished, in no distress,  normal affect  ENT: No lesions,  mouth clear,  oropharynx clear, no postnasal drip  One tooth left, full dentures   Neck: No JVD, no TMG, no carotid bruits  Lungs: No use of accessory muscles, no dullness to percussion, distant bs  Cardiovascular: RRR, heart sounds normal, no murmur or gallops, no peripheral edema  Abdomen: obese/ soft and NT, no HSM,  BS normal/ pos late insp hoover's   Musculoskeletal: No deformities, no cyanosis or clubbing  Neuro: alert, non focal  Skin: Warm,  no lesions or rashes  No results found.    CXR PA and Lateral:   03/31/2015 :    I personally reviewed images and agree with radiology impression as follows:   No active cardiopulmonary disease. Previously described pulmonary nodules on CT are too small to be characterized on radiographs.       Assessment & Plan:   Outpatient Encounter Prescriptions as of 03/31/2015  Medication Sig  . amLODipine (NORVASC) 5 MG tablet Take 1 tablet by mouth daily.  Marland Kitchen aspirin 81 MG tablet Take 81 mg by mouth daily.  . clopidogrel (PLAVIX) 75 MG tablet Take 75 mg by mouth daily.   . Cyanocobalamin (VITAMIN B-12 IJ) Inject as directed every 30  (thirty) days.  Marland Kitchen dicyclomine (BENTYL) 20 MG tablet As needed  . Fluticasone-Salmeterol (ADVAIR DISKUS) 250-50 MCG/DOSE AEPB Inhale 1 puff into the lungs 2 (two) times daily.  . hydrochlorothiazide (HYDRODIURIL) 12.5 MG tablet Take 1 tablet by mouth daily.  . Multiple Vitamin (MULTIVITAMIN) tablet Take 1 tablet by mouth daily.  . Triamcinolone Acetonide 0.025 % LOTN as needed.  . [DISCONTINUED] PROCTOSOL HC 2.5 % rectal cream as needed.   No facility-administered encounter medications on file as of 03/31/2015.

## 2015-03-31 NOTE — Patient Instructions (Addendum)
Please remember to go to the   x-ray department downstairs for your tests - we will call you with the results when they are available.   If you are satisfied with your treatment plan,  let your doctor know and he/she can either refill your medications or you can return here when your prescription runs out.     If in any way you are not 100% satisfied,  please tell us.  If 100% better, tell your friends!  Pulmonary follow up is as needed        

## 2015-04-05 ENCOUNTER — Encounter: Payer: Self-pay | Admitting: Internal Medicine

## 2015-04-05 NOTE — Assessment & Plan Note (Signed)
CT Chest 07/31/13: stable 64mm nodule RLL.  No change from 12/14. CT Chest 08/13/14: stable 57mm nodule RLL , unchanged from 2014 there fore is benign, no further scan needed  Pulmonary f/u can be prn

## 2015-04-05 NOTE — Assessment & Plan Note (Addendum)
Pfts 07/15/13:  FeV1 1.46 (56%)  59% ratio 68   RV/TLC 145%  dlco  59%> 66%   He barely meets the criteria for COPD based on his ratio and is very well compensated on Advair twice daily so there's no reason to change his regimen at this point. If he becomes about more limited in terms of desired activities that would not hesitate to add a LAMA  or even do a lama laba combination.   I had an extended  Final summary discussion with the patient reviewing all relevant studies completed to date and  lasting 15 to 20 minutes of a 25 minute visit    Each maintenance medication was reviewed in detail including most importantly the difference between maintenance and prns and under what circumstances the prns are to be triggered using an action plan format that is not reflected in the computer generated alphabetically organized AVS.    Please see instructions for details which were reviewed in writing and the patient given a copy highlighting the part that I personally wrote and discussed at today's ov.

## 2015-04-13 ENCOUNTER — Telehealth: Payer: Self-pay | Admitting: Internal Medicine

## 2015-04-13 MED ORDER — FLUTICASONE-SALMETEROL 250-50 MCG/DOSE IN AEPB: 1.0000 | INHALATION_SPRAY | Freq: Two times a day (BID) | RESPIRATORY_TRACT | Status: DC

## 2015-04-13 NOTE — Telephone Encounter (Signed)
RX for advair has been sent in. Nothing further needed

## 2015-04-22 DIAGNOSIS — D510 Vitamin B12 deficiency anemia due to intrinsic factor deficiency: Secondary | ICD-10-CM | POA: Diagnosis not present

## 2015-07-14 DIAGNOSIS — D510 Vitamin B12 deficiency anemia due to intrinsic factor deficiency: Secondary | ICD-10-CM | POA: Diagnosis not present

## 2015-08-05 DIAGNOSIS — H5203 Hypermetropia, bilateral: Secondary | ICD-10-CM | POA: Diagnosis not present

## 2015-08-26 DIAGNOSIS — D510 Vitamin B12 deficiency anemia due to intrinsic factor deficiency: Secondary | ICD-10-CM | POA: Diagnosis not present

## 2015-08-27 DIAGNOSIS — Z79899 Other long term (current) drug therapy: Secondary | ICD-10-CM | POA: Diagnosis not present

## 2015-08-27 DIAGNOSIS — E559 Vitamin D deficiency, unspecified: Secondary | ICD-10-CM | POA: Diagnosis not present

## 2015-08-27 DIAGNOSIS — R5381 Other malaise: Secondary | ICD-10-CM | POA: Diagnosis not present

## 2015-08-27 DIAGNOSIS — D510 Vitamin B12 deficiency anemia due to intrinsic factor deficiency: Secondary | ICD-10-CM | POA: Diagnosis not present

## 2015-08-27 DIAGNOSIS — E538 Deficiency of other specified B group vitamins: Secondary | ICD-10-CM | POA: Diagnosis not present

## 2015-08-27 DIAGNOSIS — I10 Essential (primary) hypertension: Secondary | ICD-10-CM | POA: Diagnosis not present

## 2015-11-02 DIAGNOSIS — E538 Deficiency of other specified B group vitamins: Secondary | ICD-10-CM | POA: Diagnosis not present

## 2015-12-02 DIAGNOSIS — D510 Vitamin B12 deficiency anemia due to intrinsic factor deficiency: Secondary | ICD-10-CM | POA: Diagnosis not present

## 2016-01-29 DIAGNOSIS — Z23 Encounter for immunization: Secondary | ICD-10-CM | POA: Diagnosis not present

## 2016-01-29 DIAGNOSIS — Z0000 Encounter for general adult medical examination without abnormal findings: Secondary | ICD-10-CM | POA: Diagnosis not present

## 2016-01-29 DIAGNOSIS — E538 Deficiency of other specified B group vitamins: Secondary | ICD-10-CM | POA: Diagnosis not present

## 2016-04-21 DIAGNOSIS — Z23 Encounter for immunization: Secondary | ICD-10-CM | POA: Diagnosis not present

## 2016-04-21 DIAGNOSIS — S41112A Laceration without foreign body of left upper arm, initial encounter: Secondary | ICD-10-CM | POA: Diagnosis not present

## 2016-04-21 DIAGNOSIS — E538 Deficiency of other specified B group vitamins: Secondary | ICD-10-CM | POA: Diagnosis not present

## 2016-05-05 ENCOUNTER — Other Ambulatory Visit: Payer: Self-pay | Admitting: Internal Medicine

## 2016-05-09 ENCOUNTER — Other Ambulatory Visit: Payer: Self-pay | Admitting: Internal Medicine

## 2016-06-03 DIAGNOSIS — D510 Vitamin B12 deficiency anemia due to intrinsic factor deficiency: Secondary | ICD-10-CM | POA: Diagnosis not present

## 2016-06-13 DIAGNOSIS — Z79899 Other long term (current) drug therapy: Secondary | ICD-10-CM | POA: Diagnosis not present

## 2016-06-13 DIAGNOSIS — I10 Essential (primary) hypertension: Secondary | ICD-10-CM | POA: Diagnosis not present

## 2016-06-13 DIAGNOSIS — E559 Vitamin D deficiency, unspecified: Secondary | ICD-10-CM | POA: Diagnosis not present

## 2016-06-13 DIAGNOSIS — Z9181 History of falling: Secondary | ICD-10-CM | POA: Diagnosis not present

## 2016-06-13 DIAGNOSIS — R5383 Other fatigue: Secondary | ICD-10-CM | POA: Diagnosis not present

## 2016-06-13 DIAGNOSIS — Z1389 Encounter for screening for other disorder: Secondary | ICD-10-CM | POA: Diagnosis not present

## 2016-06-13 DIAGNOSIS — L309 Dermatitis, unspecified: Secondary | ICD-10-CM | POA: Diagnosis not present

## 2016-06-13 DIAGNOSIS — E785 Hyperlipidemia, unspecified: Secondary | ICD-10-CM | POA: Diagnosis not present

## 2016-07-18 DIAGNOSIS — E538 Deficiency of other specified B group vitamins: Secondary | ICD-10-CM | POA: Diagnosis not present

## 2016-07-18 DIAGNOSIS — B9689 Other specified bacterial agents as the cause of diseases classified elsewhere: Secondary | ICD-10-CM | POA: Diagnosis not present

## 2016-07-18 DIAGNOSIS — R5381 Other malaise: Secondary | ICD-10-CM | POA: Diagnosis not present

## 2016-07-18 DIAGNOSIS — E291 Testicular hypofunction: Secondary | ICD-10-CM | POA: Diagnosis not present

## 2016-07-18 DIAGNOSIS — J208 Acute bronchitis due to other specified organisms: Secondary | ICD-10-CM | POA: Diagnosis not present

## 2016-07-19 DIAGNOSIS — R5383 Other fatigue: Secondary | ICD-10-CM | POA: Diagnosis not present

## 2016-07-19 DIAGNOSIS — E291 Testicular hypofunction: Secondary | ICD-10-CM | POA: Diagnosis not present

## 2016-08-16 DIAGNOSIS — R7989 Other specified abnormal findings of blood chemistry: Secondary | ICD-10-CM | POA: Diagnosis not present

## 2016-08-16 DIAGNOSIS — M542 Cervicalgia: Secondary | ICD-10-CM | POA: Diagnosis not present

## 2016-08-22 DIAGNOSIS — E7800 Pure hypercholesterolemia, unspecified: Secondary | ICD-10-CM | POA: Diagnosis not present

## 2016-08-22 DIAGNOSIS — Z0000 Encounter for general adult medical examination without abnormal findings: Secondary | ICD-10-CM | POA: Diagnosis not present

## 2016-08-22 DIAGNOSIS — E291 Testicular hypofunction: Secondary | ICD-10-CM | POA: Diagnosis not present

## 2016-08-22 DIAGNOSIS — R5383 Other fatigue: Secondary | ICD-10-CM | POA: Diagnosis not present

## 2016-08-22 DIAGNOSIS — I10 Essential (primary) hypertension: Secondary | ICD-10-CM | POA: Diagnosis not present

## 2016-08-22 DIAGNOSIS — E538 Deficiency of other specified B group vitamins: Secondary | ICD-10-CM | POA: Diagnosis not present

## 2016-09-28 DIAGNOSIS — D510 Vitamin B12 deficiency anemia due to intrinsic factor deficiency: Secondary | ICD-10-CM | POA: Diagnosis not present

## 2016-10-05 DIAGNOSIS — H6120 Impacted cerumen, unspecified ear: Secondary | ICD-10-CM | POA: Diagnosis not present

## 2016-10-06 DIAGNOSIS — L989 Disorder of the skin and subcutaneous tissue, unspecified: Secondary | ICD-10-CM | POA: Diagnosis not present

## 2016-10-06 DIAGNOSIS — L819 Disorder of pigmentation, unspecified: Secondary | ICD-10-CM | POA: Diagnosis not present

## 2016-10-06 DIAGNOSIS — L820 Inflamed seborrheic keratosis: Secondary | ICD-10-CM | POA: Diagnosis not present

## 2016-10-06 DIAGNOSIS — L821 Other seborrheic keratosis: Secondary | ICD-10-CM | POA: Diagnosis not present

## 2016-10-07 DIAGNOSIS — L821 Other seborrheic keratosis: Secondary | ICD-10-CM | POA: Diagnosis not present

## 2016-11-29 DIAGNOSIS — D510 Vitamin B12 deficiency anemia due to intrinsic factor deficiency: Secondary | ICD-10-CM | POA: Diagnosis not present

## 2017-01-11 DIAGNOSIS — D510 Vitamin B12 deficiency anemia due to intrinsic factor deficiency: Secondary | ICD-10-CM | POA: Diagnosis not present

## 2017-02-15 DIAGNOSIS — E538 Deficiency of other specified B group vitamins: Secondary | ICD-10-CM | POA: Diagnosis not present

## 2017-02-15 DIAGNOSIS — Z23 Encounter for immunization: Secondary | ICD-10-CM | POA: Diagnosis not present

## 2017-04-17 DIAGNOSIS — M79602 Pain in left arm: Secondary | ICD-10-CM | POA: Diagnosis not present

## 2017-04-17 DIAGNOSIS — E538 Deficiency of other specified B group vitamins: Secondary | ICD-10-CM | POA: Diagnosis not present

## 2017-06-27 DIAGNOSIS — E538 Deficiency of other specified B group vitamins: Secondary | ICD-10-CM | POA: Diagnosis not present

## 2017-08-04 ENCOUNTER — Other Ambulatory Visit: Payer: Self-pay

## 2017-08-04 NOTE — Patient Outreach (Signed)
[facility]) Care Management  08/04/2017  Derek [name] 06/30/1940 [id number]   Medication Adherence call to [name] patient is showing past due under [facility] Ins.on Atorvastatin 10 mg spoke with [name] he said he had call [facility] and they keep telling him he did not have refill on this medication and he need it to contact the doctors office I offer [name] to call [facility] and the doctors office so that he can received his medication he mention he was past due on this medication, [facility] said patient had refills and will fill  medication and have it ready along with other medication,call patient back informing him of what [facility] had said.   Lillia Abed CPhT Pharmacy Technician [facility] Management Direct Dial [phone]  Fax [phone] [email or web address]

## 2017-08-23 DIAGNOSIS — E785 Hyperlipidemia, unspecified: Secondary | ICD-10-CM | POA: Diagnosis not present

## 2017-08-23 DIAGNOSIS — Z23 Encounter for immunization: Secondary | ICD-10-CM | POA: Diagnosis not present

## 2017-08-23 DIAGNOSIS — Z0000 Encounter for general adult medical examination without abnormal findings: Secondary | ICD-10-CM | POA: Diagnosis not present

## 2017-08-23 DIAGNOSIS — Z1331 Encounter for screening for depression: Secondary | ICD-10-CM | POA: Diagnosis not present

## 2017-08-23 DIAGNOSIS — Z9181 History of falling: Secondary | ICD-10-CM | POA: Diagnosis not present

## 2017-08-23 DIAGNOSIS — Z79899 Other long term (current) drug therapy: Secondary | ICD-10-CM | POA: Diagnosis not present

## 2017-08-23 DIAGNOSIS — D519 Vitamin B12 deficiency anemia, unspecified: Secondary | ICD-10-CM | POA: Diagnosis not present

## 2017-08-23 DIAGNOSIS — Z1339 Encounter for screening examination for other mental health and behavioral disorders: Secondary | ICD-10-CM | POA: Diagnosis not present

## 2017-08-28 DIAGNOSIS — R6889 Other general symptoms and signs: Secondary | ICD-10-CM | POA: Diagnosis not present

## 2017-09-09 IMAGING — DX DG CHEST 2V
2 series · 2 of 2 positions shown · non-contrast
Comparison: Chest CT 08/13/2014

CLINICAL DATA: Follow-up pneumonia. No complains. History of
pulmonary nodules

EXAM:
CHEST  2 VIEW

[chest pa]
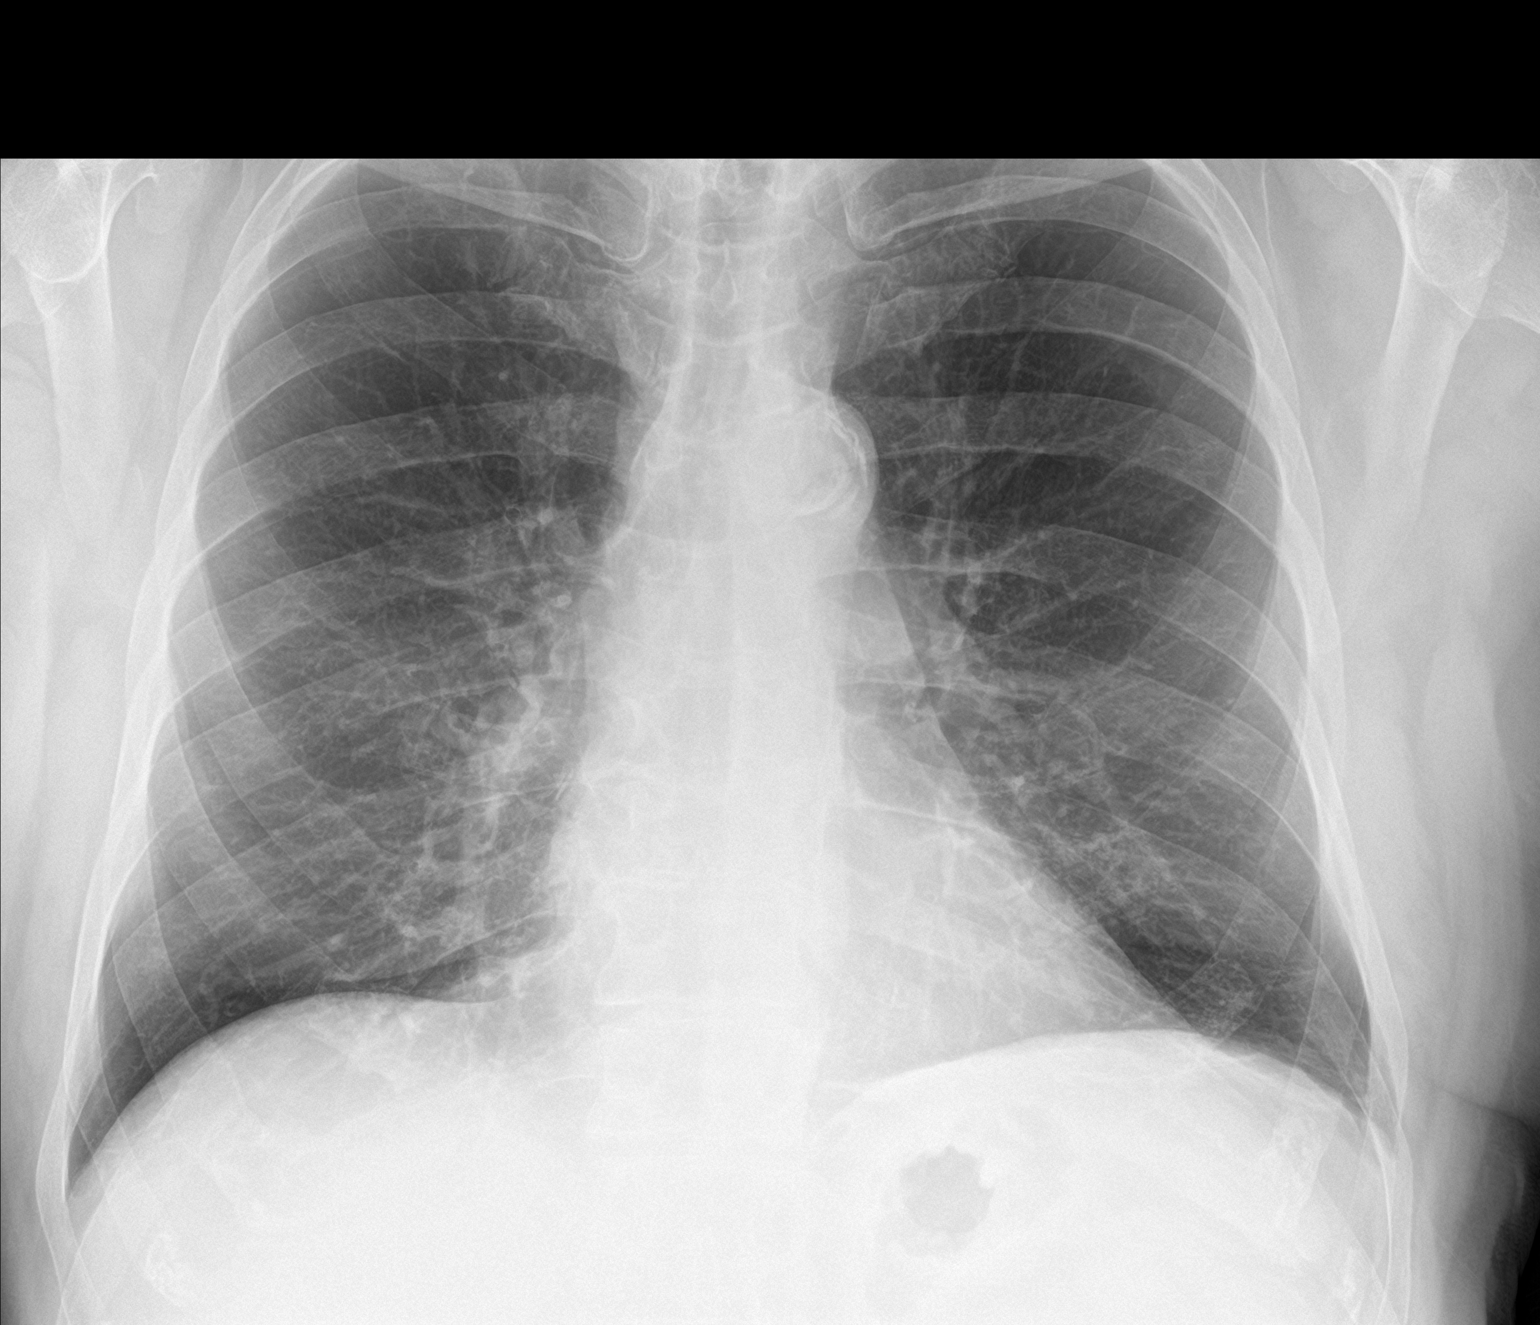

[chest lat]
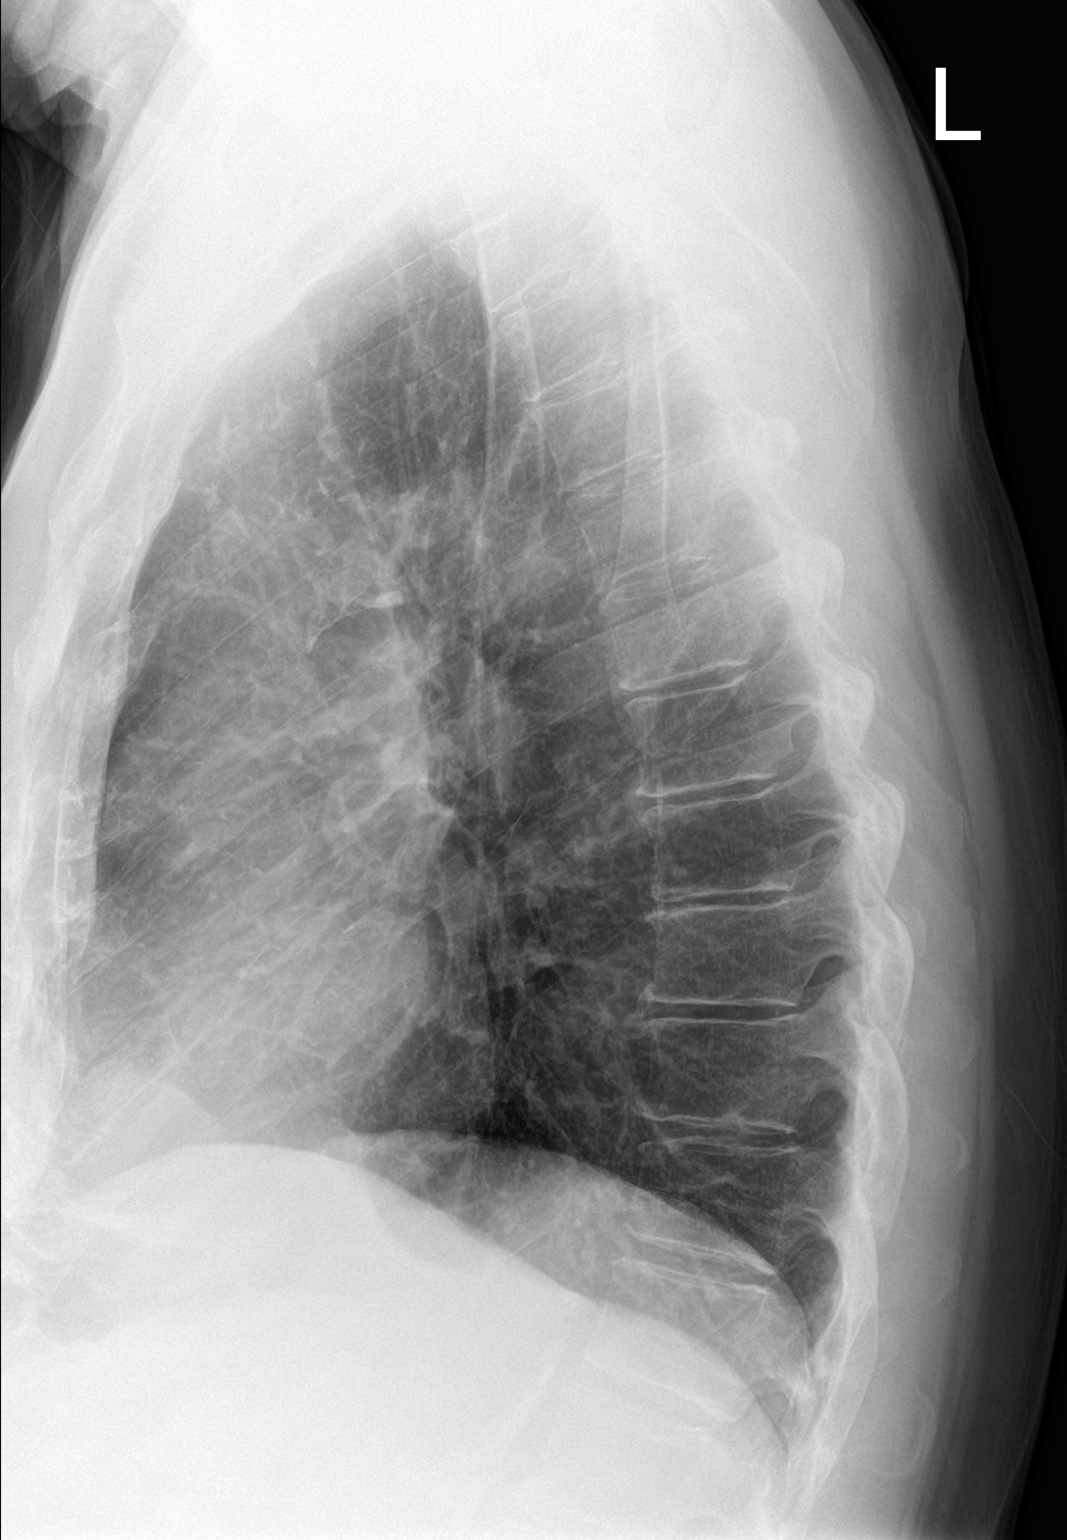

[2 of 2 positions shown; findings below may reference images not displayed]

FINDINGS: Both lungs are clear. There is no evidence for airspace disease or
pulmonary edema. Heart and mediastinum are within normal limits.
Atherosclerotic calcifications at the aortic arch. Trachea is
midline. No pleural effusions.
IMPRESSION: No active cardiopulmonary disease.

Previously described pulmonary nodules on CT are too small to be
characterized on radiographs.

## 2017-09-27 DIAGNOSIS — D510 Vitamin B12 deficiency anemia due to intrinsic factor deficiency: Secondary | ICD-10-CM | POA: Diagnosis not present

## 2017-10-06 ENCOUNTER — Other Ambulatory Visit: Payer: Self-pay | Admitting: *Deleted

## 2017-10-06 ENCOUNTER — Encounter: Payer: Self-pay | Admitting: Vascular Surgery

## 2017-10-06 ENCOUNTER — Other Ambulatory Visit: Payer: Self-pay

## 2017-10-06 ENCOUNTER — Encounter: Payer: Self-pay | Admitting: *Deleted

## 2017-10-06 ENCOUNTER — Ambulatory Visit (INDEPENDENT_AMBULATORY_CARE_PROVIDER_SITE_OTHER): Payer: Medicare Other | Admitting: Vascular Surgery

## 2017-10-06 VITALS — Ht 70.0 in | Wt 207.0 lb

## 2017-10-06 DIAGNOSIS — I739 Peripheral vascular disease, unspecified: Secondary | ICD-10-CM | POA: Diagnosis not present

## 2017-10-06 NOTE — Progress Notes (Signed)
Patient ID: Derek [name], male   DOB: 09-23-1940, 77 y.o.   MRN: [id number]  Reason for Consult: Claudication (Abnormal ABI's.  Last seen by [facility] 2015.  Bilat leg cramps after walking for a while.)   Referred by Noni Saupe, MD  Subjective:     HPI:  Derek [name] is a 77 y.o. male with a history of a left femoral to popliteal artery bypass graft with vein in 1998.  He subsequently had bilateral iliac artery stents placed by Dr. Elpidio Anis in 2011.  Last visit in 2015 he was having some claudication-like symptoms with cramping in his calves but there is no indication for procedure.  He has recently undergone outside the ABIs.  This was done for persistent cramping from his buttocks down to his calves with very short distance walking.  He is unable to walk up any hills at all without having pain.  He has difficulty getting to the mailbox and has difficulty getting from the cart to his golf ball while golfing.  He is a former smoker.  He continues take aspirin Plavix.  He denies tissue loss or ulceration.  Past Medical History:  Diagnosis Date  . Claudication (HCC)   . COPD (chronic obstructive pulmonary disease) (HCC)   . Degenerative arthritis   . Hypertension   . Obese   . PAD (peripheral artery disease) (HCC)   . Pulmonary nodule   . Pure hypercholesterolemia    Family History  Problem Relation Age of Onset  . Dementia Father   . Dementia Mother    Past Surgical History:  Procedure Laterality Date  . AORTA - ILIAC ARTERY BYPASS GRAFT      Short Social History:  Social History   Tobacco Use  . Smoking status: Former Smoker    Packs/day: 2.50    Years: 46.00    Pack years: 115.00    Types: Cigarettes    Last attempt to quit: 05/23/1996    Years since quitting: 21.3  . Smokeless tobacco: Never Used  Substance Use Topics  . Alcohol use: Yes    Alcohol/week: 1.8 oz    Types: 3 Cans of beer per week    Allergies  Allergen Reactions  . Aleve [Naproxen Sodium]     Due to blood pressure  . Ibuprofen Diarrhea    Current Outpatient Medications  Medication Sig Dispense Refill  . amLODipine (NORVASC) 5 MG tablet Take 1 tablet by mouth daily.    Marland Kitchen aspirin 81 MG tablet Take 81 mg by mouth daily.    . clopidogrel (PLAVIX) 75 MG tablet Take 75 mg by mouth daily.     . Cyanocobalamin (VITAMIN B-12 IJ) Inject as directed every 30 (thirty) days.    Marland Kitchen dicyclomine (BENTYL) 20 MG tablet As needed    . Fluticasone-Salmeterol (ADVAIR DISKUS) 250-50 MCG/DOSE AEPB Inhale 1 puff into the lungs 2 (two) times daily. 60 each 11  . hydrochlorothiazide (HYDRODIURIL) 12.5 MG tablet Take 1 tablet by mouth daily.    . Multiple Vitamin (MULTIVITAMIN) tablet Take 1 tablet by mouth daily.    . Triamcinolone Acetonide 0.025 % LOTN as needed.     No current facility-administered medications for this visit.     Review of Systems  Constitutional:  Constitutional negative. HENT: HENT negative.  Eyes: Eyes negative.  Respiratory: Positive for shortness of breath.  Cardiovascular: Positive for claudication and dyspnea with exertion.  GI: Gastrointestinal negative.  Musculoskeletal: Musculoskeletal negative.  Skin: Skin negative.  Neurological:  Neurological negative. Hematologic: Hematologic/lymphatic negative.  Psychiatric: Psychiatric negative.        Objective:  Objective   Vitals:   10/06/17 1044  Weight: 207 lb (93.9 kg)  Height: 5\' 10"  (1.778 m)   Body mass index is 29.7 kg/m.  Physical Exam  Constitutional: He appears well-developed.  HENT:  Head: Normocephalic.  Eyes: Pupils are equal, round, and reactive to light.  Neck: Normal range of motion.  Cardiovascular: Normal rate.  Pulses:      Radial pulses are 2+ on the right side, and 2+ on the left side.       Femoral pulses are 1+ on the right side, and 0 on the left side.      Popliteal pulses are 0 on the right side, and 0 on the left side.  Abdominal: Soft. He exhibits no mass.  Musculoskeletal:  Normal range of motion. He exhibits no edema.  Neurological: He is alert.  Skin: Skin is warm and dry. Capillary refill takes less than 2 seconds.  Psychiatric: He has a normal mood and affect. His behavior is normal. Judgment and thought content normal.    Data: I reviewed his outside ABIs which are 0.76 on the right and 0.68 on the left.     Assessment/Plan:     77 year old male follows up with very short distance life limiting claudication.  His ABIs on the right 0.76 and this is similar to his previous exam here but on the left have dropped from 0.89-0 point 6A.  Last visit he also had some decreased velocities in his graft distally on the left and this was monophasic.  From the standpoint I have concerned that he may have both proximal disease in his iliac artery stents as well as the idea that his 77 year old bypass graft on the left may have also thrombosed.  Given the severity of his symptoms we will proceed with angiogram with possible intervention starting with the iliac artery stents and possibly the left lower extremity at the same time if this is possible.  He does understand that given the long history of disease may be difficult to intervene endovascularly.  He demonstrates good understanding we will get him scheduled today.     Maeola Harman MD [facility]

## 2017-10-30 ENCOUNTER — Encounter (HOSPITAL_COMMUNITY): Payer: Self-pay | Admitting: *Deleted

## 2017-10-30 ENCOUNTER — Observation Stay (HOSPITAL_COMMUNITY)
Admission: RE | Admit: 2017-10-30 | Discharge: 2017-10-31 | Disposition: A | Discharge status: Home / Self Care | Payer: Medicare Other | Source: Ambulatory Visit | Attending: Vascular Surgery | Admitting: Vascular Surgery

## 2017-10-30 ENCOUNTER — Encounter (HOSPITAL_COMMUNITY)
Admission: RE | Disposition: A | Discharge status: Home / Self Care | Payer: Self-pay | Source: Ambulatory Visit | Attending: Vascular Surgery

## 2017-10-30 DIAGNOSIS — Z7951 Long term (current) use of inhaled steroids: Secondary | ICD-10-CM | POA: Diagnosis not present

## 2017-10-30 DIAGNOSIS — E7800 Pure hypercholesterolemia, unspecified: Secondary | ICD-10-CM | POA: Diagnosis not present

## 2017-10-30 DIAGNOSIS — Z7902 Long term (current) use of antithrombotics/antiplatelets: Secondary | ICD-10-CM | POA: Insufficient documentation

## 2017-10-30 DIAGNOSIS — Z79899 Other long term (current) drug therapy: Secondary | ICD-10-CM | POA: Diagnosis not present

## 2017-10-30 DIAGNOSIS — I739 Peripheral vascular disease, unspecified: Secondary | ICD-10-CM | POA: Diagnosis not present

## 2017-10-30 DIAGNOSIS — Z7982 Long term (current) use of aspirin: Secondary | ICD-10-CM | POA: Insufficient documentation

## 2017-10-30 DIAGNOSIS — I10 Essential (primary) hypertension: Secondary | ICD-10-CM | POA: Insufficient documentation

## 2017-10-30 DIAGNOSIS — Z95820 Peripheral vascular angioplasty status with implants and grafts: Secondary | ICD-10-CM | POA: Diagnosis not present

## 2017-10-30 DIAGNOSIS — Z6829 Body mass index (BMI) 29.0-29.9, adult: Secondary | ICD-10-CM | POA: Diagnosis not present

## 2017-10-30 DIAGNOSIS — Z87891 Personal history of nicotine dependence: Secondary | ICD-10-CM | POA: Diagnosis not present

## 2017-10-30 DIAGNOSIS — I70212 Atherosclerosis of native arteries of extremities with intermittent claudication, left leg: Secondary | ICD-10-CM | POA: Diagnosis not present

## 2017-10-30 DIAGNOSIS — M1990 Unspecified osteoarthritis, unspecified site: Secondary | ICD-10-CM | POA: Diagnosis not present

## 2017-10-30 DIAGNOSIS — Z886 Allergy status to analgesic agent status: Secondary | ICD-10-CM | POA: Diagnosis not present

## 2017-10-30 DIAGNOSIS — E669 Obesity, unspecified: Secondary | ICD-10-CM | POA: Insufficient documentation

## 2017-10-30 DIAGNOSIS — J449 Chronic obstructive pulmonary disease, unspecified: Secondary | ICD-10-CM | POA: Diagnosis not present

## 2017-10-30 HISTORY — PX: PERIPHERAL VASCULAR INTERVENTION: CATH118257

## 2017-10-30 HISTORY — PX: ABDOMINAL AORTOGRAM W/LOWER EXTREMITY: CATH118223

## 2017-10-30 HISTORY — DX: Irritable bowel syndrome, unspecified: K58.9

## 2017-10-30 LAB — POCT I-STAT, CHEM 8
BUN: 17 mg/dL (ref 6–20)
CALCIUM ION: 1.18 mmol/L (ref 1.15–1.40)
CHLORIDE: 104 mmol/L (ref 101–111)
Creatinine, Ser: 0.8 mg/dL (ref 0.61–1.24)
GLUCOSE: 111 mg/dL — AB (ref 65–99)
HCT: 43 % (ref 39.0–52.0)
HEMOGLOBIN: 14.6 g/dL (ref 13.0–17.0)
Potassium: 4.3 mmol/L (ref 3.5–5.1)
Sodium: 140 mmol/L (ref 135–145)
TCO2: 27 mmol/L (ref 22–32)

## 2017-10-30 LAB — CBC
HCT: 36.7 % — ABNORMAL LOW (ref 39.0–52.0)
HEMOGLOBIN: 11.9 g/dL — AB (ref 13.0–17.0)
MCH: 30.3 pg (ref 26.0–34.0)
MCHC: 32.4 g/dL (ref 30.0–36.0)
MCV: 93.4 fL (ref 78.0–100.0)
Platelets: 318 10*3/uL (ref 150–400)
RBC: 3.93 MIL/uL — AB (ref 4.22–5.81)
RDW: 13 % (ref 11.5–15.5)
WBC: 15.7 10*3/uL — ABNORMAL HIGH (ref 4.0–10.5)

## 2017-10-30 LAB — CREATININE, SERUM
CREATININE: 1.08 mg/dL (ref 0.61–1.24)
GFR calc Af Amer: >60 mL/min (ref >60)
GFR calc non Af Amer: >60 mL/min (ref >60)

## 2017-10-30 LAB — POCT ACTIVATED CLOTTING TIME: Activated Clotting Time: 230 seconds

## 2017-10-30 SURGERY — ABDOMINAL AORTOGRAM W/LOWER EXTREMITY
Anesthesia: LOCAL

## 2017-10-30 MED ORDER — SODIUM CHLORIDE 0.9 % IV SOLN
INTRAVENOUS | Status: AC | PRN
  Administered 2017-10-30: 250 mL via INTRAVENOUS

## 2017-10-30 MED ORDER — ACETAMINOPHEN 325 MG PO TABS
ORAL_TABLET | ORAL | Status: AC
  Filled 2017-10-30: qty 2

## 2017-10-30 MED ORDER — HEPARIN SODIUM (PORCINE) 1000 UNIT/ML IJ SOLN
INTRAMUSCULAR | Status: DC | PRN
  Administered 2017-10-30: 10000 [IU] via INTRAVENOUS

## 2017-10-30 MED ORDER — HEPARIN (PORCINE) IN NACL 2-0.9 UNITS/ML
INTRAMUSCULAR | Status: AC | PRN
  Administered 2017-10-30: 1000 mL via INTRA_ARTERIAL

## 2017-10-30 MED ORDER — SODIUM CHLORIDE 0.9 % IV SOLN
INTRAVENOUS | Status: AC
  Administered 2017-10-30: 18:00:00 via INTRAVENOUS

## 2017-10-30 MED ORDER — MIDAZOLAM HCL 2 MG/2ML IJ SOLN
INTRAMUSCULAR | Status: AC
  Filled 2017-10-30: qty 2

## 2017-10-30 MED ORDER — HYDRALAZINE HCL 20 MG/ML IJ SOLN: 5.0000 mg | INTRAMUSCULAR | Status: DC | PRN

## 2017-10-30 MED ORDER — IODIXANOL 320 MG/ML IV SOLN
INTRAVENOUS | Status: DC | PRN
  Administered 2017-10-30: 155 mL via INTRA_ARTERIAL

## 2017-10-30 MED ORDER — ONDANSETRON HCL 4 MG/2ML IJ SOLN: 4.0000 mg | Freq: Four times a day (QID) | INTRAMUSCULAR | Status: DC | PRN

## 2017-10-30 MED ORDER — HEPARIN SODIUM (PORCINE) 5000 UNIT/ML IJ SOLN
5000.0000 [IU] | Freq: Three times a day (TID) | INTRAMUSCULAR | Status: DC
  Administered 2017-10-30 – 2017-10-31 (×2): 5000 [IU] via SUBCUTANEOUS
  Filled 2017-10-30: qty 1

## 2017-10-30 MED ORDER — HEPARIN (PORCINE) IN NACL 1000-0.9 UT/500ML-% IV SOLN
INTRAVENOUS | Status: AC
  Filled 2017-10-30: qty 1000

## 2017-10-30 MED ORDER — FENTANYL CITRATE (PF) 100 MCG/2ML IJ SOLN
INTRAMUSCULAR | Status: DC | PRN
  Administered 2017-10-30 (×2): 50 ug via INTRAVENOUS

## 2017-10-30 MED ORDER — ACETAMINOPHEN 325 MG PO TABS
650.0000 mg | ORAL_TABLET | ORAL | Status: DC | PRN
  Administered 2017-10-30 (×3): 650 mg via ORAL
  Filled 2017-10-30 (×2): qty 2

## 2017-10-30 MED ORDER — AMLODIPINE BESYLATE 5 MG PO TABS
5.0000 mg | ORAL_TABLET | Freq: Every day | ORAL | Status: DC
  Administered 2017-10-30 – 2017-10-31 (×2): 5 mg via ORAL
  Filled 2017-10-30 (×2): qty 1

## 2017-10-30 MED ORDER — LIDOCAINE HCL (PF) 1 % IJ SOLN
INTRAMUSCULAR | Status: DC | PRN
  Administered 2017-10-30: 10 mL

## 2017-10-30 MED ORDER — LIDOCAINE HCL (PF) 1 % IJ SOLN
INTRAMUSCULAR | Status: AC
  Filled 2017-10-30: qty 30

## 2017-10-30 MED ORDER — OXYCODONE HCL 5 MG PO TABS
5.0000 mg | ORAL_TABLET | ORAL | Status: DC | PRN
  Administered 2017-10-30 – 2017-10-31 (×4): 5 mg via ORAL
  Filled 2017-10-30 (×4): qty 1

## 2017-10-30 MED ORDER — ANGIOPLASTY BOOK
Freq: Once | Status: AC
  Administered 2017-10-30: 22:00:00
  Filled 2017-10-30: qty 1

## 2017-10-30 MED ORDER — CLOPIDOGREL BISULFATE 75 MG PO TABS
75.0000 mg | ORAL_TABLET | Freq: Every day | ORAL | Status: DC
  Administered 2017-10-31: 75 mg via ORAL
  Filled 2017-10-30: qty 1

## 2017-10-30 MED ORDER — ADULT MULTIVITAMIN W/MINERALS CH
1.0000 | ORAL_TABLET | Freq: Every day | ORAL | Status: DC
  Administered 2017-10-30 – 2017-10-31 (×2): 1 via ORAL
  Filled 2017-10-30 (×2): qty 1

## 2017-10-30 MED ORDER — SODIUM CHLORIDE 0.9 % IV SOLN
INTRAVENOUS | Status: DC
  Administered 2017-10-30: 08:00:00 via INTRAVENOUS

## 2017-10-30 MED ORDER — HYDROCHLOROTHIAZIDE 25 MG PO TABS
12.5000 mg | ORAL_TABLET | Freq: Every day | ORAL | Status: DC
  Administered 2017-10-30 – 2017-10-31 (×2): 12.5 mg via ORAL
  Filled 2017-10-30 (×2): qty 1

## 2017-10-30 MED ORDER — SODIUM CHLORIDE 0.9% FLUSH
3.0000 mL | Freq: Two times a day (BID) | INTRAVENOUS | Status: DC
  Administered 2017-10-30: 22:00:00 3 mL via INTRAVENOUS

## 2017-10-30 MED ORDER — MORPHINE SULFATE (PF) 2 MG/ML IV SOLN: 2.0000 mg | INTRAVENOUS | Status: DC | PRN

## 2017-10-30 MED ORDER — SODIUM CHLORIDE 0.9% FLUSH: 3.0000 mL | INTRAVENOUS | Status: DC | PRN

## 2017-10-30 MED ORDER — ATORVASTATIN CALCIUM 10 MG PO TABS
10.0000 mg | ORAL_TABLET | Freq: Every day | ORAL | Status: DC
Start: 2017-10-30 — End: 2017-10-31
  Administered 2017-10-30: 22:00:00 10 mg via ORAL
  Filled 2017-10-30: qty 1

## 2017-10-30 MED ORDER — MIDAZOLAM HCL 2 MG/2ML IJ SOLN
INTRAMUSCULAR | Status: DC | PRN
  Administered 2017-10-30: 1 mg via INTRAVENOUS

## 2017-10-30 MED ORDER — LABETALOL HCL 5 MG/ML IV SOLN: 10.0000 mg | INTRAVENOUS | Status: DC | PRN

## 2017-10-30 MED ORDER — FENTANYL CITRATE (PF) 100 MCG/2ML IJ SOLN
INTRAMUSCULAR | Status: AC
  Filled 2017-10-30: qty 2

## 2017-10-30 MED ORDER — SODIUM CHLORIDE 0.9 % IV SOLN: 250.0000 mL | INTRAVENOUS | Status: DC | PRN

## 2017-10-30 SURGICAL SUPPLY — 23 items
BALLN MUSTANG 6.0X100X75 (BALLOONS) ×3
BALLN MUSTANG 7.0X20 75 (BALLOONS) ×3
BALLOON MUSTANG 6.0X100X75 (BALLOONS) IMPLANT
BALLOON MUSTANG 7.0X20 75 (BALLOONS) IMPLANT
CATH OMNI FLUSH 5F 65CM (CATHETERS) ×1 IMPLANT
COVER PRB 48X5XTLSCP FOLD TPE (BAG) IMPLANT
COVER PROBE 5X48 (BAG) ×3
DEVICE CLOSURE MYNXGRIP 6/7F (Vascular Products) ×2 IMPLANT
DEVICE TORQUE .025-.038 (MISCELLANEOUS) ×2 IMPLANT
GLIDEWIRE ADV .035X180CM (WIRE) ×1 IMPLANT
KIT ENCORE 26 ADVANTAGE (KITS) ×2 IMPLANT
KIT MICROPUNCTURE NIT STIFF (SHEATH) ×2 IMPLANT
KIT PV (KITS) ×3 IMPLANT
SHEATH AVANTI 11CM 5FR (SHEATH) ×2 IMPLANT
SHEATH HIGHFLEX ANSEL 6FRX55 (SHEATH) ×2 IMPLANT
SHEATH PINNACLE 6F 10CM (SHEATH) ×2 IMPLANT
STENT INNOVA 7X150X130 (Permanent Stent) ×1 IMPLANT
STENT VIABAHN 6X100X120 (Permanent Stent) ×1 IMPLANT
SYR MEDRAD MARK V 150ML (SYRINGE) ×3 IMPLANT
TRANSDUCER W/STOPCOCK (MISCELLANEOUS) ×3 IMPLANT
TRAY PV CATH (CUSTOM PROCEDURE TRAY) ×3 IMPLANT
WIRE BENTSON .035X145CM (WIRE) ×1 IMPLANT
WIRE G V18X300CM (WIRE) ×2 IMPLANT

## 2017-10-30 NOTE — Op Note (Signed)
Patient name: Derek [name] MRN: [id number] DOB: 14-Jan-1941 Sex: male  10/30/2017 Pre-operative Diagnosis: Peripheral arterial disease Post-operative diagnosis:  Same Surgeon:  Apolinar Junes C. Randie Heinz, MD Procedure Performed: 1.  Ultrasound-guided cannulation right common femoral artery 2.  Aortogram with bilateral lower extremity runoff 3.  Stent of left external and common iliac arteries with 7 x 150 mm Innova and 6 x 100 viabahn 4.  Minx closure right common femoral artery 5.  Moderate sedation with fentanyl and Versed for 65 minutes.  Indications: 77 year old male with history of peripheral arterial disease and had short distance claudication.  He now has severe life limitation.  He has an ABI of 0.8 on the right and 0.6 on the left with known common iliac artery stents.  He also has a left femoral to popliteal artery bypass on the left with vein 20 years ago.  He is now indicated for angiogram possible intervention on the left.  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  Previous stents are patent.  On the left side the site of interest the left common iliac artery distally in the left external iliac artery are heavily calcified and there is approximately 90% stenosis right where the hypogastric artery was but this is occluded.  On the right side the hypogastric arteries patent there is no flow-limiting stenosis in the external or common iliac arteries beyond the stent.  The right side the SFA is occluded at its takeoff reconstitutes a above-knee popliteal artery with what appears to be three-vessel runoff.  On the left side the femoral to popliteal artery bypass is patent with three-vessel runoff.  After intervention with primary stenting of his left common external iliac artery we had a rupture of the area of 90% stenosis.  A Biobond was then placed.  He now has 0% residual stenosis and no flow-limiting dissection were once he had multiple levels of stenosis one near  90%.   Procedure:  The patient was identified in the holding area and taken to room 8.  The patient was then placed supine on the table and prepped and draped in the usual sterile fashion.  A time out was called.  Ultrasound was used to evaluate the right common femoral artery which was diseased but noted to be patent and an image was saved to the permanent record.  Ultrasound was used to cannulate this under direct visualization with a micropuncture needle followed by wire and sheath.  We then placed a short 5 French sheath and performed aortogram with bilateral lower extremity runoff via Omni catheter.  With the above findings we elected to intervene.  We crossed the bifurcation with the Omni and Glidewire advantage and got this into the bypass graft.  We then exchanged for a long 6 French sheath and the patient was given 10,000 units of heparin.  We took some angled views of the external common iliac arteries and the left and decided to primarily stent.  A 7 x 150 and over the stent was brought to the field and was deployed.  We then postdilated with a 6 mm balloon and had one area of persistent residual stenosis of 70%.  A 7 x 20 balloon was then used and this did dilate easily there was concern that this did rupture.  Angiogram demonstrated acute rupture so the for 6 mm balloon was then inflated.  Through this we then placed an 018 wire brought a viabahn 6 x 100 stent and deployed this.  This was postdilated with a 6 mm balloon.  Completion demonstrated no flow-limiting stenosis brisk flow through both stents.  Satisfied with this we exchanged for a short 6 French sheath and a minx device was deployed.  Unfortunately pressure had to be held after this.  He did tolerate the procedure well aside from the time of rupture when IV fluid ministration was given for low blood pressure.  Given the complication with the procedure he will be admitted for overnight observation.  Next  Contrast: 155cc    Bambi Fehnel C.  Randie Heinz, MD [facility] [place] Office: [phone] Pager: [phone]

## 2017-10-30 NOTE — H&P (Signed)
   History and Physical Update  The patient was interviewed and re-examined.  The patient's previous History and Physical has been reviewed and is unchanged from recent office visit. Plan for aortogram with possible intervention bilateral iliac arteries and left lower extremity.  Arleigh Dicola C. Randie Heinz, MD [facility] [place] Office: [phone] Pager: [phone]  10/30/2017, 7:36 AM

## 2017-10-30 NOTE — Progress Notes (Signed)
RFA Mynx site clear, no redness or edema. Site unremarkable. Awaiting bed assignment.

## 2017-10-31 ENCOUNTER — Encounter (HOSPITAL_COMMUNITY): Payer: Self-pay | Admitting: Vascular Surgery

## 2017-10-31 ENCOUNTER — Other Ambulatory Visit: Payer: Self-pay

## 2017-10-31 ENCOUNTER — Telehealth: Payer: Self-pay | Admitting: Vascular Surgery

## 2017-10-31 DIAGNOSIS — Z87891 Personal history of nicotine dependence: Secondary | ICD-10-CM | POA: Diagnosis not present

## 2017-10-31 DIAGNOSIS — Z79899 Other long term (current) drug therapy: Secondary | ICD-10-CM | POA: Diagnosis not present

## 2017-10-31 DIAGNOSIS — I10 Essential (primary) hypertension: Secondary | ICD-10-CM | POA: Diagnosis not present

## 2017-10-31 DIAGNOSIS — Z7951 Long term (current) use of inhaled steroids: Secondary | ICD-10-CM | POA: Diagnosis not present

## 2017-10-31 DIAGNOSIS — Z7982 Long term (current) use of aspirin: Secondary | ICD-10-CM | POA: Diagnosis not present

## 2017-10-31 DIAGNOSIS — M1990 Unspecified osteoarthritis, unspecified site: Secondary | ICD-10-CM | POA: Diagnosis not present

## 2017-10-31 DIAGNOSIS — I70212 Atherosclerosis of native arteries of extremities with intermittent claudication, left leg: Secondary | ICD-10-CM | POA: Diagnosis not present

## 2017-10-31 DIAGNOSIS — J449 Chronic obstructive pulmonary disease, unspecified: Secondary | ICD-10-CM | POA: Diagnosis not present

## 2017-10-31 DIAGNOSIS — Z95820 Peripheral vascular angioplasty status with implants and grafts: Secondary | ICD-10-CM | POA: Diagnosis not present

## 2017-10-31 DIAGNOSIS — E7800 Pure hypercholesterolemia, unspecified: Secondary | ICD-10-CM | POA: Diagnosis not present

## 2017-10-31 DIAGNOSIS — Z7902 Long term (current) use of antithrombotics/antiplatelets: Secondary | ICD-10-CM | POA: Diagnosis not present

## 2017-10-31 DIAGNOSIS — Z886 Allergy status to analgesic agent status: Secondary | ICD-10-CM | POA: Diagnosis not present

## 2017-10-31 LAB — CBC
HCT: 31.9 % — ABNORMAL LOW (ref 39.0–52.0)
HEMOGLOBIN: 10.3 g/dL — AB (ref 13.0–17.0)
MCH: 30.3 pg (ref 26.0–34.0)
MCHC: 32.3 g/dL (ref 30.0–36.0)
MCV: 93.8 fL (ref 78.0–100.0)
Platelets: 257 10*3/uL (ref 150–400)
RBC: 3.4 MIL/uL — AB (ref 4.22–5.81)
RDW: 13.1 % (ref 11.5–15.5)
WBC: 12.9 10*3/uL — AB (ref 4.0–10.5)

## 2017-10-31 LAB — BASIC METABOLIC PANEL
ANION GAP: 8 (ref 5–15)
BUN: 16 mg/dL (ref 6–20)
CALCIUM: 8.5 mg/dL — AB (ref 8.9–10.3)
CO2: 26 mmol/L (ref 22–32)
Chloride: 104 mmol/L (ref 101–111)
Creatinine, Ser: 0.93 mg/dL (ref 0.61–1.24)
Glucose, Bld: 137 mg/dL — ABNORMAL HIGH (ref 65–99)
POTASSIUM: 3.8 mmol/L (ref 3.5–5.1)
Sodium: 138 mmol/L (ref 135–145)

## 2017-10-31 NOTE — Progress Notes (Signed)
  Progress Note    10/31/2017 10:26 AM 1 Day Post-Op  Subjective:  Feeling much better today  Vitals:   10/31/17 0451 10/31/17 0748  BP: (!) 137/49 133/60  Pulse: 68 78  Resp: (!) 21 18  Temp: 97.6 F (36.4 C) 97.8 F (36.6 C)  SpO2: 96% 94%    Physical Exam: Awake alert oriented Nonlabored respirations Abdomen is soft Right groin without hematoma There is strong posterior tibial and dorsalis pedis signals on the left.  CBC    Component Value Date/Time   WBC 12.9 (H) 10/31/2017 0232   RBC 3.40 (L) 10/31/2017 0232   HGB 10.3 (L) 10/31/2017 0232   HCT 31.9 (L) 10/31/2017 0232   PLT 257 10/31/2017 0232   MCV 93.8 10/31/2017 0232   MCH 30.3 10/31/2017 0232   MCHC 32.3 10/31/2017 0232   RDW 13.1 10/31/2017 0232    BMET    Component Value Date/Time   NA 138 10/31/2017 0232   K 3.8 10/31/2017 0232   CL 104 10/31/2017 0232   CO2 26 10/31/2017 0232   GLUCOSE 137 (H) 10/31/2017 0232   BUN 16 10/31/2017 0232   CREATININE 0.93 10/31/2017 0232   CALCIUM 8.5 (L) 10/31/2017 0232   GFRNONAA >60 10/31/2017 0232   GFRAA >60 10/31/2017 0232    INR No results found for: INR   Intake/Output Summary (Last 24 hours) at 10/31/2017 1026 Last data filed at 10/31/2017 0800 Gross per 24 hour  Intake 1157.5 ml  Output 650 ml  Net 507.5 ml     Assessment:  77 y.o. male is s/p stenting of left external iliac artery complicated by rupture  required covering stent.  Plan: Okay for discharge today.  Follow-up in 4 to 6 weeks with duplex and ABI.   Liyat Faulkenberry C. Randie Heinz, MD [facility] [place] Office: [phone] Pager: [phone]  10/31/2017 10:26 AM

## 2017-10-31 NOTE — Discharge Instructions (Signed)
° °  [facility] [place] ° °Discharge Instructions ° °Lower Extremity Angiogram; Angioplasty/Stenting ° °Please refer to the following instructions for your post-procedure care. Your surgeon or physician assistant will discuss any changes with you. ° °Activity ° °Avoid lifting more than 8 pounds (1 gallons of milk) for 72 hours (3 days) after your procedure. You may walk as much as you can tolerate. It's OK to drive after 72 hours. ° °Bathing/Showering ° °You may shower the day after your procedure. If you have a bandage, you may remove it at 24- 48 hours. Clean your incision site with mild soap and water. Pat the area dry with a clean towel. ° °Diet ° °Resume your pre-procedure diet. There are no special food restrictions following this procedure. All patients with peripheral vascular disease should follow a low fat/low cholesterol diet. In order to heal from your surgery, it is CRITICAL to get adequate nutrition. Your body requires vitamins, minerals, and protein. Vegetables are the best source of vitamins and minerals. Vegetables also provide the perfect balance of protein. Processed food has little nutritional value, so try to avoid this. ° °Medications ° °Resume taking all of your medications unless your doctor tells you not to. If your incision is causing pain, you may take over-the-counter pain relievers such as acetaminophen (Tylenol) ° °Follow Up ° °Follow up will be arranged at the time of your procedure. You may have an office visit scheduled or may be scheduled for surgery. Ask your surgeon if you have any questions. ° °Please call us immediately for any of the following conditions: °•Severe or worsening pain your legs or feet at rest or with walking. °•Increased pain, redness, drainage at your groin puncture site. °•Fever of 101 degrees or higher. °•If you have any mild or slow bleeding from your puncture site: lie down, apply firm constant pressure over the area with a piece of  gauze or a clean wash cloth for 30 minutes- no peeking!, call 911 right away if you are still bleeding after 30 minutes, or if the bleeding is heavy and unmanageable. ° °Reduce your risk factors of vascular disease: ° °Stop smoking. If you would like help call [facility] at 1-800-QUIT-NOW (1-800-784-8669) or [place] at 336-586-4000. °Manage your cholesterol °Maintain a desired weight °Control your diabetes °Keep your blood pressure down ° °If you have any questions, please call the office at [phone] ° °

## 2017-10-31 NOTE — Telephone Encounter (Signed)
-----   Message from Westley Hummer, RN sent at 10/31/2017  3:04 PM EDT ----- Regarding: Appointment   ----- Message ----- From: Dara Lords, PA-C Sent: 10/31/2017   8:02 AM To: [facility] Charge Pool  S/p agm and iliac artery stenting 10/30/17.  F/u with Dr. Randie Heinz in 4 weeks with aortoiliac duplex and abi's in 4 weeks.  Thanks [employer]

## 2017-10-31 NOTE — Telephone Encounter (Signed)
sch appt spk to pt 12/07/17 8am Aorto/iliac 9am ABI 12/08/17 815am p/o MD

## 2017-10-31 NOTE — Discharge Summary (Signed)
Discharge Summary    Derek [name] 1940-06-22 77 y.o. male  [id number]  Admission Date: 10/30/2017  Discharge Date: 10/31/17  Physician: Juventino Slovak*  Admission Diagnosis: PAD (peripheral artery disease) (HCC) [I73.9]   HPI:   This is a 77 y.o. male with history of peripheral arterial disease and had short distance claudication.  He now has severe life limitation.  He has an ABI of 0.8 on the right and 0.6 on the left with known common iliac artery stents.  He also has a left femoral to popliteal artery bypass on the left with vein 20 years ago.  He is now indicated for angiogram possible intervention on the left.  Hospital Course:  The patient was admitted to the hospital and taken to the operating room on 10/30/2017 and underwent: 1.  Ultrasound-guided cannulation right common femoral artery 2.  Aortogram with bilateral lower extremity runoff 3.  Stent of left external and common iliac arteries with 7 x 150 mm Innova and 6 x 100 viabahn 4.  Minx closure right common femoral artery 5.  Moderate sedation with fentanyl and Versed for 65 minutes     The pt tolerated the procedure well and was transported to the PACU in good condition.   By POD 1, he was doing well.  His right groin was without hematoma.  There was a strong PT and DP doppler signal on the left.  He was discharged home.   The remainder of the hospital course consisted of increasing mobilization and increasing intake of solids without difficulty.  CBC    Component Value Date/Time   WBC 12.9 (H) 10/31/2017 0232   RBC 3.40 (L) 10/31/2017 0232   HGB 10.3 (L) 10/31/2017 0232   HCT 31.9 (L) 10/31/2017 0232   PLT 257 10/31/2017 0232   MCV 93.8 10/31/2017 0232   MCH 30.3 10/31/2017 0232   MCHC 32.3 10/31/2017 0232   RDW 13.1 10/31/2017 0232    BMET    Component Value Date/Time   NA 138 10/31/2017 0232   K 3.8 10/31/2017 0232   CL 104 10/31/2017 0232   CO2 26 10/31/2017 0232   GLUCOSE 137 (H)  10/31/2017 0232   BUN 16 10/31/2017 0232   CREATININE 0.93 10/31/2017 0232   CALCIUM 8.5 (L) 10/31/2017 0232   GFRNONAA >60 10/31/2017 0232   GFRAA >60 10/31/2017 0232      Discharge Instructions    Discharge patient   Complete by:  As directed    Discharge disposition:  01-Home or Self Care   Discharge patient date:  10/31/2017      Discharge Diagnosis:  PAD (peripheral artery disease) (HCC) [I73.9]  Secondary Diagnosis: Patient Active Problem List   Diagnosis Date Noted  . Atherosclerosis of native arteries of the extremities with intermittent claudication 02/04/2014  . Hypertension   . PAD (peripheral artery disease) (HCC)   . Pure hypercholesterolemia   . Degenerative arthritis   . COPD GOLD II    . Pulmonary nodule    Past Medical History:  Diagnosis Date  . Claudication (HCC)   . COPD (chronic obstructive pulmonary disease) (HCC)   . Degenerative arthritis   . Hypertension   . Obese   . PAD (peripheral artery disease) (HCC)   . Pulmonary nodule   . Pure hypercholesterolemia      Allergies as of 10/31/2017      Reactions   Aleve [naproxen Sodium]    Due to blood pressure   Ibuprofen Diarrhea  Medication List    TAKE these medications   amLODipine 5 MG tablet Commonly known as:  NORVASC Take 1 tablet by mouth daily.   aspirin 81 MG tablet Take 81 mg by mouth daily.   atorvastatin 10 MG tablet Commonly known as:  LIPITOR Take 10 mg by mouth at bedtime.   clopidogrel 75 MG tablet Commonly known as:  PLAVIX Take 75 mg by mouth daily.   COLY-MYCIN S 3.07-23-08-0.5 MG/ML OTIC suspension Generic drug:  neomycin-colistin-hydrocortisone-thonzonium Place 4 drops into both ears daily as needed.   Fluticasone-Salmeterol 250-50 MCG/DOSE Aepb Commonly known as:  ADVAIR DISKUS Inhale 1 puff into the lungs 2 (two) times daily.   WIXELA INHUB 100-50 MCG/DOSE Aepb Generic drug:  Fluticasone-Salmeterol Inhale 1 puff into the lungs 2 (two) times  daily.   hydrochlorothiazide 12.5 MG tablet Commonly known as:  HYDRODIURIL Take 1 tablet by mouth daily.   multivitamin tablet Take 1 tablet by mouth daily.   VITAMIN B-12 IJ Inject as directed every 30 (thirty) days.       Prescriptions given: none  Instructions:   [facility]  Discharge Instructions  Lower Extremity Angiogram; Angioplasty/Stenting  Please refer to the following instructions for your post-procedure care. Your surgeon or physician assistant will discuss any changes with you.  Activity  Avoid lifting more than 8 pounds (1 gallons of milk) for 72 hours (3 days) after your procedure. You may walk as much as you can tolerate. It's OK to drive after 72 hours.  Bathing/Showering  You may shower the day after your procedure. If you have a bandage, you may remove it at 24- 48 hours. Clean your incision site with mild soap and water. Pat the area dry with a clean towel.  Diet  Resume your pre-procedure diet. There are no special food restrictions following this procedure. All patients with peripheral vascular disease should follow a low fat/low cholesterol diet. In order to heal from your surgery, it is CRITICAL to get adequate nutrition. Your body requires vitamins, minerals, and protein. Vegetables are the best source of vitamins and minerals. Vegetables also provide the perfect balance of protein. Processed food has little nutritional value, so try to avoid this.  Medications  Resume taking all of your medications unless your doctor tells you not to. If your incision is causing pain, you may take over-the-counter pain relievers such as acetaminophen (Tylenol)  Follow Up  Follow up will be arranged at the time of your procedure. You may have an office visit scheduled or may be scheduled for surgery. Ask your surgeon if you have any questions.  Please call us immediately for any of the following conditions: .Severe or  worsening pain your legs or feet at rest or with walking. .Increased pain, redness, drainage at your groin puncture site. .Fever of 101 degrees or higher. .If you have any mild or slow bleeding from your puncture site: lie down, apply firm constant pressure over the area with a piece of gauze or a clean wash cloth for 30 minutes- no peeking!, call 911 right away if you are still bleeding after 30 minutes, or if the bleeding is heavy and unmanageable.  Reduce your risk factors of vascular disease:  . Stop smoking. If you would like help call [facility] at 1-800-QUIT-NOW (714-828-1744) or [place] at [phone]. . Manage your cholesterol . Maintain a desired weight . Control your diabetes . Keep your blood pressure down .  If you have any questions, please call  the office at [phone]   Disposition: home  Patient's condition: is Good  Follow up: 1. Dr. Randie Heinz in 4 weeks with aortoiliac duplex and abi's.    Doreatha Massed, PA-C [facility] [phone] 10/31/2017  8:04 AM

## 2017-11-01 ENCOUNTER — Other Ambulatory Visit: Payer: Self-pay

## 2017-11-01 DIAGNOSIS — I739 Peripheral vascular disease, unspecified: Secondary | ICD-10-CM

## 2017-11-03 ENCOUNTER — Other Ambulatory Visit: Payer: Self-pay | Admitting: *Deleted

## 2017-11-03 MED ORDER — TRAMADOL HCL 50 MG PO TABS: 50.0000 mg | ORAL_TABLET | Freq: Four times a day (QID) | ORAL | 0 refills | Status: DC | PRN

## 2017-11-03 MED FILL — Heparin Sod (Porcine)-NaCl IV Soln 1000 Unit/500ML-0.9%: INTRAVENOUS | Qty: 1000 | Status: AC

## 2017-11-24 DIAGNOSIS — K5900 Constipation, unspecified: Secondary | ICD-10-CM | POA: Diagnosis not present

## 2017-11-24 DIAGNOSIS — E538 Deficiency of other specified B group vitamins: Secondary | ICD-10-CM | POA: Diagnosis not present

## 2017-11-24 DIAGNOSIS — K921 Melena: Secondary | ICD-10-CM | POA: Diagnosis not present

## 2017-12-07 ENCOUNTER — Ambulatory Visit (HOSPITAL_COMMUNITY)
Admission: RE | Admit: 2017-12-07 | Discharge: 2017-12-07 | Disposition: A | Discharge status: Home / Self Care | Payer: Medicare Other | Source: Ambulatory Visit | Attending: Vascular Surgery | Admitting: Vascular Surgery

## 2017-12-07 ENCOUNTER — Ambulatory Visit (INDEPENDENT_AMBULATORY_CARE_PROVIDER_SITE_OTHER)
Admission: RE | Admit: 2017-12-07 | Discharge: 2017-12-07 | Disposition: A | Discharge status: Home / Self Care | Payer: Medicare Other | Source: Ambulatory Visit | Attending: Vascular Surgery | Admitting: Vascular Surgery

## 2017-12-07 DIAGNOSIS — I739 Peripheral vascular disease, unspecified: Secondary | ICD-10-CM | POA: Insufficient documentation

## 2017-12-08 ENCOUNTER — Encounter: Payer: Self-pay | Admitting: Vascular Surgery

## 2017-12-08 ENCOUNTER — Ambulatory Visit (INDEPENDENT_AMBULATORY_CARE_PROVIDER_SITE_OTHER): Payer: Medicare Other | Admitting: Vascular Surgery

## 2017-12-08 ENCOUNTER — Other Ambulatory Visit: Payer: Self-pay

## 2017-12-08 VITALS — BP 142/87 | HR 71 | Resp 20 | Ht 70.5 in | Wt 206.9 lb

## 2017-12-08 DIAGNOSIS — I739 Peripheral vascular disease, unspecified: Secondary | ICD-10-CM | POA: Diagnosis not present

## 2017-12-08 NOTE — Progress Notes (Signed)
Patient ID: BETTY DADA, male   DOB: Aug 28, 1940, 77 y.o.   MRN: [id number]  Reason for Consult: Follow-up (4 wk f/u )   Referred by Noni Saupe, MD  Subjective:     HPI:  ZYMIRE MIKES is a 77 y.o. male underwent left common and external iliac artery stenting for occlusive disease.  He has a previous femoral-popliteal bypass with vein greater than 20 years ago.  States that now he is able to walk better but he has not been walking as much given the heat.  He does take Plavix.  He has no tissue loss or ulceration.  He does complain of cramping of his right calf.  He is excited to get back playing golf and walk more.  Past Medical History:  Diagnosis Date  . Claudication (HCC)   . COPD (chronic obstructive pulmonary disease) (HCC)   . Hypertension   . IBS (irritable bowel syndrome)   . Obese   . PAD (peripheral artery disease) (HCC)   . Pulmonary nodule   . Pure hypercholesterolemia    Family History  Problem Relation Age of Onset  . Dementia Father   . Dementia Mother    Past Surgical History:  Procedure Laterality Date  . ABDOMINAL AORTOGRAM W/LOWER EXTREMITY N/A 10/30/2017   Procedure: ABDOMINAL AORTOGRAM W/LOWER EXTREMITY;  Surgeon: Maeola Harman, MD;  Location: [facility] INVASIVE CV LAB;  Service: Cardiovascular;  Laterality: N/A;  . AORTA - ILIAC ARTERY BYPASS GRAFT    . PERIPHERAL VASCULAR INTERVENTION  10/30/2017   Procedure: PERIPHERAL VASCULAR INTERVENTION;  Surgeon: Maeola Harman, MD;  Location: [facility] INVASIVE CV LAB;  Service: Cardiovascular;;  LT Iliac    Short Social History:  Social History   Tobacco Use  . Smoking status: Former Smoker    Packs/day: 2.50    Years: 46.00    Pack years: 115.00    Types: Cigarettes    Last attempt to quit: 05/23/1996    Years since quitting: 21.5  . Smokeless tobacco: Never Used  Substance Use Topics  . Alcohol use: Yes    Alcohol/week: 1.8 oz    Types: 3 Cans of beer per week    Allergies    Allergen Reactions  . Aleve [Naproxen Sodium]     Due to blood pressure  . Ibuprofen Diarrhea    Current Outpatient Medications  Medication Sig Dispense Refill  . amLODipine (NORVASC) 5 MG tablet Take 1 tablet by mouth daily.    Marland Kitchen aspirin 81 MG tablet Take 81 mg by mouth daily.    Marland Kitchen atorvastatin (LIPITOR) 10 MG tablet Take 10 mg by mouth at bedtime.  5  . clopidogrel (PLAVIX) 75 MG tablet Take 75 mg by mouth daily.     Marland Kitchen COLY-MYCIN S 3.07-23-08-0.5 MG/ML OTIC suspension Place 4 drops into both ears daily as needed.  0  . Cyanocobalamin (VITAMIN B-12 IJ) Inject as directed every 30 (thirty) days.    . Fluticasone-Salmeterol (ADVAIR DISKUS) 250-50 MCG/DOSE AEPB Inhale 1 puff into the lungs 2 (two) times daily. 60 each 11  . hydrochlorothiazide (HYDRODIURIL) 12.5 MG tablet Take 1 tablet by mouth daily.    . Multiple Vitamin (MULTIVITAMIN) tablet Take 1 tablet by mouth daily.    . traMADol (ULTRAM) 50 MG tablet Take 1 tablet (50 mg total) by mouth every 6 (six) hours as needed. 20 tablet 0  . WIXELA INHUB 100-50 MCG/DOSE AEPB Inhale 1 puff into the lungs 2 (two) times daily.  3  No current facility-administered medications for this visit.     Review of Systems  Constitutional:  Constitutional negative. HENT: HENT negative.  Eyes: Eyes negative.  Respiratory: Respiratory negative.  Cardiovascular: Positive for claudication.  GI: Gastrointestinal negative.  GU:       Erectile dysfunction Musculoskeletal: Positive for joint pain.  Skin: Skin negative.  Neurological: Neurological negative. Psychiatric: Psychiatric negative.        Objective:  Objective   Vitals:   12/08/17 0821 12/08/17 0824  BP: (!) 147/80 (!) 142/87  Pulse: 71   Resp: 20   SpO2: 94%   Weight: 206 lb 14.4 oz (93.8 kg)   Height: 5' 10.5" (1.791 m)    Body mass index is 29.27 kg/m.  Physical Exam  Constitutional: He is oriented to person, place, and time. He appears well-developed.  HENT:  Head:  Normocephalic.  Eyes: Pupils are equal, round, and reactive to light.  Neck: Normal range of motion.  Cardiovascular: Normal rate.  Pulmonary/Chest: Effort normal.  Abdominal: Soft.  Musculoskeletal: Normal range of motion. He exhibits no edema.  Neurological: He is alert and oriented to person, place, and time.  Skin: Skin is warm and dry.  Psychiatric: He has a normal mood and affect. His behavior is normal. Judgment and thought content normal.    Data: I reviewed his ABIs is 0.7 on the right and 0.99 on the left  Left external iliac artery has velocities greater than 250cm/s consistent with greater than 50% stenosis distal to the stent.     Assessment/Plan:     77 year old male follows up after left common external iliac artery stenting.  Claudication symptoms on the left seem to have resolved he has a palpable popliteal pulse distal to his bypass.  He also has very strong signals in his left foot.  I have encouraged him to continue walking and taking Plavix.  We will have him follow-up in 3 months with repeat studies given the elevated velocities in his recent iliac artery duplex.  If his symptoms remain unchanged at that time I would not intervene.     Maeola Harman MD [facility]

## 2017-12-21 ENCOUNTER — Other Ambulatory Visit: Payer: Self-pay

## 2017-12-21 DIAGNOSIS — I739 Peripheral vascular disease, unspecified: Secondary | ICD-10-CM

## 2018-02-27 DIAGNOSIS — E538 Deficiency of other specified B group vitamins: Secondary | ICD-10-CM | POA: Diagnosis not present

## 2018-03-16 ENCOUNTER — Ambulatory Visit: Payer: Medicare Other | Admitting: Family

## 2018-03-16 ENCOUNTER — Encounter: Payer: Self-pay | Admitting: Family

## 2018-03-16 ENCOUNTER — Encounter (HOSPITAL_COMMUNITY): Payer: Medicare Other

## 2018-03-19 ENCOUNTER — Encounter: Payer: Self-pay | Admitting: Family

## 2018-04-27 DIAGNOSIS — R05 Cough: Secondary | ICD-10-CM | POA: Diagnosis not present

## 2018-04-27 DIAGNOSIS — D510 Vitamin B12 deficiency anemia due to intrinsic factor deficiency: Secondary | ICD-10-CM | POA: Diagnosis not present

## 2018-05-03 ENCOUNTER — Encounter: Payer: Self-pay | Admitting: Family

## 2018-05-03 ENCOUNTER — Ambulatory Visit (HOSPITAL_COMMUNITY)
Admission: RE | Admit: 2018-05-03 | Discharge: 2018-05-03 | Disposition: A | Discharge status: Home / Self Care | Payer: Medicare Other | Source: Ambulatory Visit | Attending: Vascular Surgery | Admitting: Vascular Surgery

## 2018-05-03 ENCOUNTER — Ambulatory Visit (INDEPENDENT_AMBULATORY_CARE_PROVIDER_SITE_OTHER)
Admission: RE | Admit: 2018-05-03 | Discharge: 2018-05-03 | Disposition: A | Discharge status: Home / Self Care | Payer: Medicare Other | Source: Ambulatory Visit | Attending: Vascular Surgery | Admitting: Vascular Surgery

## 2018-05-03 ENCOUNTER — Ambulatory Visit (INDEPENDENT_AMBULATORY_CARE_PROVIDER_SITE_OTHER): Payer: Medicare Other | Admitting: Family

## 2018-05-03 VITALS — BP 159/84 | HR 60 | Temp 97.0°F | Resp 14 | Ht 70.5 in | Wt 209.0 lb

## 2018-05-03 DIAGNOSIS — I779 Disorder of arteries and arterioles, unspecified: Secondary | ICD-10-CM

## 2018-05-03 DIAGNOSIS — Z87891 Personal history of nicotine dependence: Secondary | ICD-10-CM | POA: Diagnosis not present

## 2018-05-03 DIAGNOSIS — I739 Peripheral vascular disease, unspecified: Secondary | ICD-10-CM

## 2018-05-03 NOTE — Progress Notes (Signed)
[facility] [place]   CC: Follow up peripheral artery occlusive disease  History of Present Illness Derek [name] is a 77 y.o. male who is s/p left common and external iliac artery stenting on 10-30-17 by Dr. Randie Heinz for occlusive disease.  He has a previous left femoral-popliteal bypass with vein greater than 20 years ago by Dr. Hart [place].    He exercises in a gym several times/week.  He does take Plavix.  He has no tissue loss or ulceration.  He does complain of cramping of his right calf.  He is excited to get back playing golf and walk more.  Dr. Randie Heinz last evaluated pt on 12-08-17. At that time claudication symptoms on the left seemed to have resolved;  he had a palpable popliteal pulse distal to his bypass.  He also had very strong signals in his left foot. Dr. Randie Heinz encouraged pt to continue walking and taking Plavix.  Pt was to follow-up in 3 months with repeat studies given the elevated velocities in his recent iliac artery duplex.  If his symptoms remain unchanged at that time Dr. Randie Heinz would not intervene.   Diabetic: No Tobacco use: former smoker, quit in 1998, smoked 2.5 ppd x 46 years  Pt meds include: Statin :Yes Betablocker: No ASA: no Other anticoagulants/antiplatelets: Plavix  Past Medical History:  Diagnosis Date  . Claudication (HCC)   . COPD (chronic obstructive pulmonary disease) (HCC)   . Hypertension   . IBS (irritable bowel syndrome)   . Obese   . PAD (peripheral artery disease) (HCC)   . Pulmonary nodule   . Pure hypercholesterolemia     Social History Social History   Tobacco Use  . Smoking status: Former Smoker    Packs/day: 2.50    Years: 46.00    Pack years: 115.00    Types: Cigarettes    Last attempt to quit: 05/23/1996    Years since quitting: 21.9  . Smokeless tobacco: Never Used  Substance Use Topics  . Alcohol use: Yes    Alcohol/week: 3.0 standard drinks    Types: 3 Cans of beer per week  . Drug use: No    Family  History Family History  Problem Relation Age of Onset  . Dementia Father   . Dementia Mother     Past Surgical History:  Procedure Laterality Date  . ABDOMINAL AORTOGRAM W/LOWER EXTREMITY N/A 10/30/2017   Procedure: ABDOMINAL AORTOGRAM W/LOWER EXTREMITY;  Surgeon: Maeola Harman, MD;  Location: [facility] INVASIVE CV LAB;  Service: Cardiovascular;  Laterality: N/A;  . AORTA - ILIAC ARTERY BYPASS GRAFT    . PERIPHERAL VASCULAR INTERVENTION  10/30/2017   Procedure: PERIPHERAL VASCULAR INTERVENTION;  Surgeon: Maeola Harman, MD;  Location: [facility] INVASIVE CV LAB;  Service: Cardiovascular;;  LT Iliac    Allergies  Allergen Reactions  . Aleve [Naproxen Sodium]     Due to blood pressure  . Ibuprofen Diarrhea    Current Outpatient Medications  Medication Sig Dispense Refill  . amLODipine (NORVASC) 5 MG tablet Take 1 tablet by mouth daily.    Marland Kitchen aspirin 81 MG tablet Take 81 mg by mouth daily.    Marland Kitchen atorvastatin (LIPITOR) 10 MG tablet Take 10 mg by mouth at bedtime.  5  . clopidogrel (PLAVIX) 75 MG tablet Take 75 mg by mouth daily.     Marland Kitchen COLY-MYCIN S 3.07-23-08-0.5 MG/ML OTIC suspension Place 4 drops into both ears daily as needed.  0  . Cyanocobalamin (VITAMIN B-12 IJ) Inject  as directed every 30 (thirty) days.    . Fluticasone-Salmeterol (ADVAIR DISKUS) 250-50 MCG/DOSE AEPB Inhale 1 puff into the lungs 2 (two) times daily. 60 each 11  . hydrochlorothiazide (HYDRODIURIL) 12.5 MG tablet Take 1 tablet by mouth daily.    . Multiple Vitamin (MULTIVITAMIN) tablet Take 1 tablet by mouth daily.    . traMADol (ULTRAM) 50 MG tablet Take 1 tablet (50 mg total) by mouth every 6 (six) hours as needed. 20 tablet 0  . WIXELA INHUB 100-50 MCG/DOSE AEPB Inhale 1 puff into the lungs 2 (two) times daily.  3   No current facility-administered medications for this visit.     ROS: See HPI for pertinent positives and negatives.   Physical Examination  Vitals:   05/03/18 1023  BP: (!) 159/84   Pulse: 60  Resp: 14  Temp: (!) 97 F (36.1 C)  SpO2: 96%  Weight: 209 lb (94.8 kg)  Height: 5' 10.5" (1.791 m)   Body mass index is 29.56 kg/m.  General: A&O x 3, WDWN, male. Gait: normal HENT: No gross abnormalities.  Eyes: PERRLA. Pulmonary: Respirations are non labored, limited air movement in all fields, no rales, rhonchi, or wheezes.  Cardiac: regular rhythm, no detected murmur.         Carotid Bruits Right Left   Negative Negative   Radial pulses are 1+ right, 2+ left palpable  Adominal aortic pulse is not palpable                         VASCULAR EXAM: Extremities without ischemic changes, without Gangrene; without open wounds.                                                                                                          LE Pulses Right Left       FEMORAL  faintly palpable  1+ palpable        POPLITEAL  not palpable   not palpable       POSTERIOR TIBIAL  not palpable   not palpable        DORSALIS PEDIS      ANTERIOR TIBIAL not palpable  1+ palpable    Abdomen: soft, NT, no palpable masses. Skin: no rashes, no cellulitis, no ulcers noted. Musculoskeletal: no muscle wasting or atrophy.  Neurologic: A&O X 3; appropriate affect, Sensation is normal; MOTOR FUNCTION:  moving all extremities equally, motor strength 5/5 throughout. Speech is fluent/normal. CN 2-12 intact except has significant hearing loss. Psychiatric: Thought content is normal, mood appropriate for clinical situation.     ASSESSMENT: Derek [name] is a 77 y.o. male who is s/p left common and external iliac artery stenting on 10-30-17 by Dr. Randie Heinz for occlusive disease.  He has a previous femoral-popliteal bypass with vein greater than 20 years ago.   There is no iliac stent in the right.   He exercises his legs 3 days/week in a gym.   After walking on an incline about 100 yards both hips start to burn, relieved  by few seconds rest; no claudication with walking on level surface.    12-08-17: Left external iliac artery has velocities greater than 250cm/s consistent with greater than 50% stenosis distal to the stent.     DATA  Abdominal Aorta Findings (05-03-18):  +-------------+-------+----------+----------+----------+--------+--------+  Location   AP (cm)Trans (cm)PSV (cm/s)Waveform ThrombusComments  +-------------+-------+----------+----------+----------+--------+--------+  Distal             111                   +-------------+-------+----------+----------+----------+--------+--------+  RT EIA Distal         170    monophasic          +-------------+-------+----------+----------+----------+--------+--------+  LT EIA Distal         194    monophasic          +-------------+-------+----------+----------+----------+--------+--------+      Right Stent(s):  +-------------+--------+--------+--------+------------+  CIA     PSV cm/sStenosisWaveformComments    +-------------+--------+--------+--------+------------+  Prox to Stent111           Distal Aorta  +-------------+--------+--------+--------+------------+  Not visualized      Left Stent(s):  +---------------+---+---------------+----------+-------------------------------+  Prox to Stent 111        biphasic Distal Aorta            +---------------+---+---------------+----------+-------------------------------+  Proximal Stent 165        monophasic                  +---------------+---+---------------+----------+-------------------------------+  Mid Stent   146        monophasic                  +---------------+---+---------------+----------+-------------------------------+  Distal Stent  20150-99% stenosismonophasic                   +---------------+---+---------------+----------+-------------------------------+  Distal to Stent194        monophasic30-49% stenosis; upper limits                          of velocity range; higher                            velocities may be obscured by                          bowel               +---------------+---+---------------+----------+-------------------------------+    Summary:  Stenosis: +--------------------+-------------+-------------------------------------------+  Location      Stenosis   Comments                    +--------------------+-------------+-------------------------------------------+  Right External Iliac<50% stenosisUpper limits of velocity range; higher                      velocities may be obscured by bowel      +--------------------+-------------+-------------------------------------------+  Left External Iliac >50% stenosis                        +--------------------+-------------+-------------------------------------------+    ABI Findings (05-03-18):  +---------+------------------+-----+---------+--------+  Right  Rt Pressure (mmHg)IndexWaveform Comment   +---------+------------------+-----+---------+--------+  Brachial 154           triphasic      +---------+------------------+-----+---------+--------+  PTA   163        1.01 biphasic       +---------+------------------+-----+---------+--------+  DP  142        0.88 biphasic       +---------+------------------+-----+---------+--------+  Great Toe121        0.75 Normal        +---------+------------------+-----+---------+--------+    +---------+------------------+-----+---------+-------+  Left   Lt Pressure  (mmHg)IndexWaveform Comment  +---------+------------------+-----+---------+-------+  Brachial 162           triphasic      +---------+------------------+-----+---------+-------+  PTA   166        1.02 triphasic      +---------+------------------+-----+---------+-------+  DP    178        1.10 biphasic       +---------+------------------+-----+---------+-------+  Great Toe126        0.78 Normal        +---------+------------------+-----+---------+-------+    +-------+-----------+-----------+------------+------------+  ABI/TBIToday's ABIToday's TBIPrevious ABIPrevious TBI  +-------+-----------+-----------+------------+------------+  Right 1.01    0.75    0.46    0.54      +-------+-----------+-----------+------------+------------+  Left  1.10    0.78    0.99    0.69      +-------+-----------+-----------+------------+------------+      Right ABIs and TBIs appear increased compared to prior study on 12/07/2017. Left TBIs appear increased compared to prior study on 12/07/2017. Left ABIs appear essentially unchanged compared to prior study on 12/07/2017.    Summary:  Right: Resting right ankle-brachial index is within normal range. No evidence of significant right lower extremity arterial disease. The right toe-brachial index is normal. RT great toe pressure = 121 mmHg.    Left: Resting left ankle-brachial index is within normal range. No evidence of significant left lower extremity arterial disease. The left toe-brachial index is normal. LT Great toe pressure = 126 mmHg.       PLAN:  Based on the patient's vascular studies and examination, pt will return to clinic in 3 months with ABI's and bilateral iliac artery stenting.  We discussed ways that he can increase his walking. I advised him to notify us if he develops concerns re the circulation in his feet or  leg.   I discussed in depth with the patient the nature of atherosclerosis, and emphasized the importance of maximal medical management including strict control of blood pressure, blood glucose, and lipid levels, obtaining regular exercise, and continued cessation of smoking.  The patient is aware that without maximal medical management the underlying atherosclerotic disease process will progress, limiting the benefit of any interventions.  The patient was given information about PAD including signs, symptoms, treatment, what symptoms should prompt the patient to seek immediate medical care, and risk reduction measures to take.  Charisse March, RN, MSN, FNP-C [facility] Phone: [phone]  Clinic MD: [facility]  05/03/18 11:10 AM

## 2018-05-03 NOTE — Patient Instructions (Signed)

## 2018-07-03 DIAGNOSIS — E538 Deficiency of other specified B group vitamins: Secondary | ICD-10-CM | POA: Diagnosis not present

## 2018-07-15 DIAGNOSIS — H1132 Conjunctival hemorrhage, left eye: Secondary | ICD-10-CM | POA: Diagnosis not present

## 2018-07-16 DIAGNOSIS — H1132 Conjunctival hemorrhage, left eye: Secondary | ICD-10-CM | POA: Diagnosis not present

## 2018-07-30 ENCOUNTER — Other Ambulatory Visit: Payer: Self-pay

## 2018-07-30 DIAGNOSIS — Z87891 Personal history of nicotine dependence: Secondary | ICD-10-CM

## 2018-07-30 DIAGNOSIS — I779 Disorder of arteries and arterioles, unspecified: Secondary | ICD-10-CM

## 2018-07-30 DIAGNOSIS — I739 Peripheral vascular disease, unspecified: Secondary | ICD-10-CM

## 2018-07-31 DIAGNOSIS — Z23 Encounter for immunization: Secondary | ICD-10-CM | POA: Diagnosis not present

## 2018-07-31 DIAGNOSIS — J309 Allergic rhinitis, unspecified: Secondary | ICD-10-CM | POA: Diagnosis not present

## 2018-07-31 DIAGNOSIS — H1132 Conjunctival hemorrhage, left eye: Secondary | ICD-10-CM | POA: Diagnosis not present

## 2018-07-31 DIAGNOSIS — E538 Deficiency of other specified B group vitamins: Secondary | ICD-10-CM | POA: Diagnosis not present

## 2018-08-06 ENCOUNTER — Other Ambulatory Visit: Payer: Self-pay

## 2018-08-06 ENCOUNTER — Ambulatory Visit: Payer: Medicare Other | Admitting: Family

## 2018-08-06 ENCOUNTER — Ambulatory Visit (HOSPITAL_COMMUNITY)
Admission: RE | Admit: 2018-08-06 | Discharge: 2018-08-06 | Disposition: A | Discharge status: Home / Self Care | Payer: Medicare Other | Source: Ambulatory Visit | Attending: Family | Admitting: Family

## 2018-08-06 ENCOUNTER — Ambulatory Visit (INDEPENDENT_AMBULATORY_CARE_PROVIDER_SITE_OTHER)
Admission: RE | Admit: 2018-08-06 | Discharge: 2018-08-06 | Disposition: A | Discharge status: Home / Self Care | Payer: Medicare Other | Source: Ambulatory Visit | Attending: Surgery | Admitting: Surgery

## 2018-08-06 ENCOUNTER — Encounter: Payer: Self-pay | Admitting: Family

## 2018-08-06 VITALS — BP 137/83 | HR 84 | Temp 96.9°F | Resp 14 | Ht 70.0 in | Wt 209.3 lb

## 2018-08-06 DIAGNOSIS — I779 Disorder of arteries and arterioles, unspecified: Secondary | ICD-10-CM | POA: Diagnosis not present

## 2018-08-06 DIAGNOSIS — I739 Peripheral vascular disease, unspecified: Secondary | ICD-10-CM

## 2018-08-06 DIAGNOSIS — Z87891 Personal history of nicotine dependence: Secondary | ICD-10-CM | POA: Diagnosis not present

## 2018-08-06 NOTE — Patient Instructions (Signed)
Peripheral Vascular Disease  Peripheral vascular disease (PVD) is a disease of the blood vessels that are not part of your heart and brain. A simple term for PVD is poor circulation. In most cases, PVD narrows the blood vessels that carry blood from your heart to the rest of your body. This can reduce the supply of blood to your arms, legs, and internal organs, like your stomach or kidneys. However, PVD most often affects a person's lower legs and feet. Without treatment, PVD tends to get worse. PVD can also lead to acute ischemic limb. This is when an arm or leg suddenly cannot get enough blood. This is a medical emergency. Follow these instructions at home: Lifestyle  Do not use any products that contain nicotine or tobacco, such as cigarettes and e-cigarettes. If you need help quitting, ask your doctor.  Lose weight if you are overweight. Or, stay at a healthy weight as told by your doctor.  Eat a diet that is low in fat and cholesterol. If you need help, ask your doctor.  Exercise regularly. Ask your doctor for activities that are right for you. General instructions  Take over-the-counter and prescription medicines only as told by your doctor.  Take good care of your feet: ? Wear comfortable shoes that fit well. ? Check your feet often for any cuts or sores.  Keep all follow-up visits as told by your doctor This is important. Contact a doctor if:  You have cramps in your legs when you walk.  You have leg pain when you are at rest.  You have coldness in a leg or foot.  Your skin changes.  You are unable to get or have an erection (erectile dysfunction).  You have cuts or sores on your feet that do not heal. Get help right away if:  Your arm or leg turns cold, numb, and blue.  Your arms or legs become red, warm, swollen, painful, or numb.  You have chest pain.  You have trouble breathing.  You suddenly have weakness in your face, arm, or leg.  You become very  confused or you cannot speak.  You suddenly have a very bad headache.  You suddenly cannot see. Summary  Peripheral vascular disease (PVD) is a disease of the blood vessels.  A simple term for PVD is poor circulation. Without treatment, PVD tends to get worse.  Treatment may include exercise, low fat and low cholesterol diet, and quitting smoking. This information is not intended to replace advice given to you by your health care provider. Make sure you discuss any questions you have with your health care provider. Document Released: 08/03/2009 Document Revised: 06/16/2016 Document Reviewed: 06/16/2016 Elsevier Interactive Patient Education  2019 Elsevier Inc.  

## 2018-08-06 NOTE — Progress Notes (Signed)
[facility] [place]   CC: Follow up peripheral artery occlusive disease  History of Present Illness Chancy MilroyJack A Butsch is a 78 y.o. male who is s/p left common and external iliac artery stenting on 10-30-17 by Dr. Randie Heinzain for occlusive disease. He has a previous left femoral-popliteal bypass with vein greater than 20 years ago by Dr. Hart RochesterLawson.   He exercises in a gym 1-2 times/week.  He does take Plavix. He has no tissue loss or ulceration.  He is excited to get back playing golf and walk more.  Dr. Randie Heinzain last evaluated pt on 12-08-17. At that time claudication symptoms on the left seemed to have resolved;  he had a palpable popliteal pulse distal to his bypass. He also had very strong signals in his left foot. Dr. Randie Heinzain encouraged pt to continue walking and taking Plavix. Pt was to follow-up in 3 months with repeat studies given the elevated velocities in his recent iliac artery duplex. If his symptoms remain unchanged at that time Dr. Randie Heinzain would not intervene.  After walking about 300 yards on an incline, he has burning in both of his upper thighs, lateral aspects, which resolves with more walking.    Diabetic: No Tobacco use: former smoker, quit in 1998, smoked 2.5 ppd x 46 years  Pt meds include: Statin :Yes Betablocker: No ASA: no Other anticoagulants/antiplatelets: Plavix    Past Medical History:  Diagnosis Date  . Claudication (HCC)   . COPD (chronic obstructive pulmonary disease) (HCC)   . Hypertension   . IBS (irritable bowel syndrome)   . Obese   . PAD (peripheral artery disease) (HCC)   . Pulmonary nodule   . Pure hypercholesterolemia     Social History Social History   Tobacco Use  . Smoking status: Former Smoker    Packs/day: 2.50    Years: 46.00    Pack years: 115.00    Types: Cigarettes    Last attempt to quit: 05/23/1996    Years since quitting: 22.2  . Smokeless tobacco: Never Used  Substance Use Topics  . Alcohol use: Yes     Alcohol/week: 3.0 standard drinks    Types: 3 Cans of beer per week  . Drug use: No    Family History Family History  Problem Relation Age of Onset  . Dementia Father   . Dementia Mother     Past Surgical History:  Procedure Laterality Date  . ABDOMINAL AORTOGRAM W/LOWER EXTREMITY N/A 10/30/2017   Procedure: ABDOMINAL AORTOGRAM W/LOWER EXTREMITY;  Surgeon: Maeola Harmanain, Brandon Christopher, MD;  [facility];  Service: Cardiovascular;  Laterality: N/A;  . AORTA - ILIAC ARTERY BYPASS GRAFT    . PERIPHERAL VASCULAR INTERVENTION  10/30/2017   Procedure: PERIPHERAL VASCULAR INTERVENTION;  Surgeon: Maeola Harmanain, Brandon Christopher, MD;  [facility];  Service: Cardiovascular;;  LT Iliac    Allergies  Allergen Reactions  . Aleve [Naproxen Sodium]     Due to blood pressure  . Ibuprofen Diarrhea    Current Outpatient Medications  Medication Sig Dispense Refill  . amLODipine (NORVASC) 5 MG tablet Take 1 tablet by mouth daily.    Marland Kitchen. atorvastatin (LIPITOR) 10 MG tablet Take 10 mg by mouth at bedtime.  5  . clopidogrel (PLAVIX) 75 MG tablet Take 75 mg by mouth daily.     Marland Kitchen. COLY-MYCIN S 3.07-23-08-0.5 MG/ML OTIC suspension Place 4 drops into both ears daily as needed.  0  . Cyanocobalamin (VITAMIN B-12 IJ) Inject as directed  every 30 (thirty) days.    . Fluticasone-Salmeterol (ADVAIR DISKUS) 250-50 MCG/DOSE AEPB Inhale 1 puff into the lungs 2 (two) times daily. 60 each 11  . hydrochlorothiazide (HYDRODIURIL) 12.5 MG tablet Take 1 tablet by mouth daily.    . Multiple Vitamin (MULTIVITAMIN) tablet Take 1 tablet by mouth daily.    . traMADol (ULTRAM) 50 MG tablet Take 1 tablet (50 mg total) by mouth every 6 (six) hours as needed. 20 tablet 0  . WIXELA INHUB 100-50 MCG/DOSE AEPB Inhale 1 puff into the lungs 2 (two) times daily.  3   No current facility-administered medications for this visit.     ROS: See HPI for pertinent positives and negatives.   Physical  Examination  Vitals:   08/06/18 1003  BP: 137/83  Pulse: 84  Resp: 14  Temp: (!) 96.9 F (36.1 C)  TempSrc: Oral  SpO2: 94%  Weight: 209 lb 4.8 oz (94.9 kg)  Height: 5\' 10"  (1.778 m)   Body mass index is 30.03 kg/m.  General: A&O x 3, WDWN, obese male. Gait: normal HENT: No gross abnormalities.  Eyes: PERRLA. Pulmonary: Respirations are non labored, limited air movement in all fields, no rales, rhonchi, or wheezes. Cardiac: regular rhythm and rate with occasional premature contractions, no detected murmur.         Carotid Bruits Right Left   Negative Negative   Radial pulses are 2+ palpable bilaterally   Adominal aortic pulse is not palpable                         VASCULAR EXAM: Extremities without ischemic changes, without Gangrene; without open wounds.                                                                                                          LE Pulses Right Left       FEMORAL  not palpable  not palpable        POPLITEAL  not palpable   not palpable       POSTERIOR TIBIAL  not palpable   not palpable        DORSALIS PEDIS      ANTERIOR TIBIAL not palpable  not palpable    Abdomen: soft, NT, no palpable masses. Skin: no rashes, no cellulitis, no ulcers noted. Musculoskeletal: no muscle wasting or atrophy.  Neurologic: A&O X 3; appropriate affect, Sensation is normal; MOTOR FUNCTION:  moving all extremities equally, motor strength 5/5 throughout. Speech is fluent/normal. CN 2-12 intact except has significant hearing loss. Psychiatric: Thought content is normal, mood appropriate for clinical situation.    ASSESSMENT: KOHLMAN BIANCA is a 78 y.o. male who is s/p left common and external iliac artery stenting on 10-30-17 by Dr. Randie Heinz for occlusive disease.  He has a previous femoral-popliteal bypass with vein greater than 20 years ago.   There is no iliac stent in the right.   He exercises his legs 1-2 days/week in a gym, but states he has  decreased his walking.  After walking about 300 yards on an incline, he has burning in both of his upper thighs, lateral aspects, which resolves with more walking.    12-08-17: Left external iliac artery has velocities greater than 250cm/sconsistent with greater than 50% stenosis distal to the stent.     DATA  Aortoiliac Duplex (08-06-18): Abdominal Aorta Findings: +-------------+-------+----------+----------+----------+--------+--------+ Location     AP (cm)Trans (cm)PSV (cm/s)Waveform  ThrombusComments +-------------+-------+----------+----------+----------+--------+--------+ Proximal                      50        monophasic                 +-------------+-------+----------+----------+----------+--------+--------+ Mid                           67        monophasic                 +-------------+-------+----------+----------+----------+--------+--------+ Distal                        72        monophasic                 +-------------+-------+----------+----------+----------+--------+--------+ RT CIA Prox                   231       monophasic                 +-------------+-------+----------+----------+----------+--------+--------+ RT CIA Mid                    123       monophasic                 +-------------+-------+----------+----------+----------+--------+--------+ RT CIA Distal                 157       monophasic                 +-------------+-------+----------+----------+----------+--------+--------+ RT EIA Prox                   232       monophasic                 +-------------+-------+----------+----------+----------+--------+--------+ RT EIA Mid                    185       monophasic                 +-------------+-------+----------+----------+----------+--------+--------+ RT EIA Distal                 179       monophasic                  +-------------+-------+----------+----------+----------+--------+--------+ LT CIA Prox                   165       monophasic                 +-------------+-------+----------+----------+----------+--------+--------+ LT CIA Mid                    168       monophasic                 +-------------+-------+----------+----------+----------+--------+--------+ LT CIA Distal  83        monophasic                 +-------------+-------+----------+----------+----------+--------+--------+ LT EIA Prox                   205       monophasic                 +-------------+-------+----------+----------+----------+--------+--------+ LT EIA Mid                    137       monophasic                 +-------------+-------+----------+----------+----------+--------+--------+ LT EIA Distal                 271       monophasic                 +-------------+-------+----------+----------+----------+--------+--------+  Doppler waveforms are monophasic however with sharp systolic upstroke.   Summary: Stenosis: Bilateral stent walls not visualized, however stents appear patent throughout their entirety. Elevated velocities noted in the right proximal common and external iliac arteries and the left proximal and distal external iliac artery, consistent  with a >50% stenosis.   ABI (Date: 08/06/2018): +---------+------------------+-----+----------+--------+ Right    Rt Pressure (mmHg)IndexWaveform  Comment  +---------+------------------+-----+----------+--------+ Brachial 140                                       +---------+------------------+-----+----------+--------+ PTA      137               0.98 biphasic           +---------+------------------+-----+----------+--------+ DP       112               0.80 monophasic         +---------+------------------+-----+----------+--------+ Great Toe75                0.54                     +---------+------------------+-----+----------+--------+  +---------+------------------+-----+---------+-------+ Left     Lt Pressure (mmHg)IndexWaveform Comment +---------+------------------+-----+---------+-------+ Brachial 138                                     +---------+------------------+-----+---------+-------+ PTA      161               1.15 triphasic        +---------+------------------+-----+---------+-------+ DP       116               0.83 triphasic        +---------+------------------+-----+---------+-------+ Great Toe111               0.79                  +---------+------------------+-----+---------+-------+  +-------+-----------+-----------+------------+------------+ ABI/TBIToday's ABIToday's TBIPrevious ABIPrevious TBI +-------+-----------+-----------+------------+------------+ Right  0.98       0.54       1.01        0.75         +-------+-----------+-----------+------------+------------+ Left   1.15       0.79       1.10        0.78         +-------+-----------+-----------+------------+------------+  Bilateral ABIs appear essentially unchanged compared to prior study on 05/03/2018. Right TBIs appear decreased compared to prior study on 05/03/2018.   Summary: Right: Resting right ankle-brachial index is within normal range. No evidence of significant right lower extremity arterial disease. The right toe-brachial index is abnormal. RT great toe pressure = 75 mmHg.  Left: Resting left ankle-brachial index is within normal range. No evidence of significant left lower extremity arterial disease. The left toe-brachial index is normal. LT Great toe pressure = 111 mmHg.     PLAN:  Based on the patient's vascular studies and examination, pt will return to clinic in 6 months with ABI's and left aortoiliac duplex. I advised pt to notify us if he develops concerns re the circulation in his feet or legs.    Walk a total of at least 30 minutes daily in a safe environment.   I discussed in depth with the patient the nature of atherosclerosis, and emphasized the importance of maximal medical management including strict control of blood pressure, blood glucose, and lipid levels, obtaining regular exercise, and continued cessation of smoking.  The patient is aware that without maximal medical management the underlying atherosclerotic disease process will progress, limiting the benefit of any interventions.  The patient was given information about PAD including signs, symptoms, treatment, what symptoms should prompt the patient to seek immediate medical care, and risk reduction measures to take.  Charisse March, RN, MSN, FNP-C [facility] [place] Phone: [phone]  Clinic MD: Myra Gianotti  08/06/18 10:44 AM

## 2018-08-14 DIAGNOSIS — L259 Unspecified contact dermatitis, unspecified cause: Secondary | ICD-10-CM | POA: Diagnosis not present

## 2018-08-14 DIAGNOSIS — M1990 Unspecified osteoarthritis, unspecified site: Secondary | ICD-10-CM | POA: Diagnosis not present

## 2018-09-11 DIAGNOSIS — R1031 Right lower quadrant pain: Secondary | ICD-10-CM | POA: Diagnosis not present

## 2018-09-11 DIAGNOSIS — Z1211 Encounter for screening for malignant neoplasm of colon: Secondary | ICD-10-CM | POA: Diagnosis not present

## 2018-09-14 DIAGNOSIS — D519 Vitamin B12 deficiency anemia, unspecified: Secondary | ICD-10-CM | POA: Diagnosis not present

## 2018-09-17 DIAGNOSIS — R109 Unspecified abdominal pain: Secondary | ICD-10-CM | POA: Diagnosis not present

## 2018-09-17 DIAGNOSIS — E785 Hyperlipidemia, unspecified: Secondary | ICD-10-CM | POA: Diagnosis not present

## 2018-09-17 DIAGNOSIS — R5383 Other fatigue: Secondary | ICD-10-CM | POA: Diagnosis not present

## 2018-09-17 DIAGNOSIS — Z0000 Encounter for general adult medical examination without abnormal findings: Secondary | ICD-10-CM | POA: Diagnosis not present

## 2018-09-17 DIAGNOSIS — I10 Essential (primary) hypertension: Secondary | ICD-10-CM | POA: Diagnosis not present

## 2018-09-17 DIAGNOSIS — D519 Vitamin B12 deficiency anemia, unspecified: Secondary | ICD-10-CM | POA: Diagnosis not present

## 2018-09-24 DIAGNOSIS — K8050 Calculus of bile duct without cholangitis or cholecystitis without obstruction: Secondary | ICD-10-CM | POA: Diagnosis not present

## 2018-09-25 DIAGNOSIS — Z1211 Encounter for screening for malignant neoplasm of colon: Secondary | ICD-10-CM | POA: Diagnosis not present

## 2018-09-27 DIAGNOSIS — R197 Diarrhea, unspecified: Secondary | ICD-10-CM | POA: Diagnosis not present

## 2018-10-04 DIAGNOSIS — R197 Diarrhea, unspecified: Secondary | ICD-10-CM | POA: Diagnosis not present

## 2018-10-04 DIAGNOSIS — R195 Other fecal abnormalities: Secondary | ICD-10-CM | POA: Diagnosis not present

## 2018-10-10 DIAGNOSIS — R197 Diarrhea, unspecified: Secondary | ICD-10-CM | POA: Diagnosis not present

## 2018-10-10 DIAGNOSIS — K5130 Ulcerative (chronic) rectosigmoiditis without complications: Secondary | ICD-10-CM | POA: Diagnosis not present

## 2018-10-10 DIAGNOSIS — Z800 Family history of malignant neoplasm of digestive organs: Secondary | ICD-10-CM | POA: Diagnosis not present

## 2018-10-10 DIAGNOSIS — K529 Noninfective gastroenteritis and colitis, unspecified: Secondary | ICD-10-CM | POA: Diagnosis not present

## 2018-10-10 DIAGNOSIS — Z86010 Personal history of colonic polyps: Secondary | ICD-10-CM | POA: Diagnosis not present

## 2018-10-10 DIAGNOSIS — R195 Other fecal abnormalities: Secondary | ICD-10-CM | POA: Diagnosis not present

## 2018-10-16 DIAGNOSIS — D519 Vitamin B12 deficiency anemia, unspecified: Secondary | ICD-10-CM | POA: Diagnosis not present

## 2018-10-18 DIAGNOSIS — J439 Emphysema, unspecified: Secondary | ICD-10-CM | POA: Diagnosis not present

## 2018-10-18 DIAGNOSIS — I10 Essential (primary) hypertension: Secondary | ICD-10-CM | POA: Diagnosis not present

## 2018-10-18 DIAGNOSIS — R1011 Right upper quadrant pain: Secondary | ICD-10-CM | POA: Diagnosis not present

## 2018-10-18 DIAGNOSIS — I441 Atrioventricular block, second degree: Secondary | ICD-10-CM | POA: Diagnosis not present

## 2018-10-18 DIAGNOSIS — K5100 Ulcerative (chronic) pancolitis without complications: Secondary | ICD-10-CM | POA: Diagnosis not present

## 2018-10-18 DIAGNOSIS — I739 Peripheral vascular disease, unspecified: Secondary | ICD-10-CM

## 2018-10-18 DIAGNOSIS — E876 Hypokalemia: Secondary | ICD-10-CM | POA: Diagnosis not present

## 2018-10-18 DIAGNOSIS — R109 Unspecified abdominal pain: Secondary | ICD-10-CM | POA: Diagnosis not present

## 2018-10-18 DIAGNOSIS — K5730 Diverticulosis of large intestine without perforation or abscess without bleeding: Secondary | ICD-10-CM | POA: Diagnosis not present

## 2018-10-18 DIAGNOSIS — K5190 Ulcerative colitis, unspecified, without complications: Secondary | ICD-10-CM | POA: Diagnosis not present

## 2018-10-19 DIAGNOSIS — E7800 Pure hypercholesterolemia, unspecified: Secondary | ICD-10-CM | POA: Diagnosis not present

## 2018-10-19 DIAGNOSIS — I739 Peripheral vascular disease, unspecified: Secondary | ICD-10-CM | POA: Diagnosis not present

## 2018-10-19 DIAGNOSIS — Z888 Allergy status to other drugs, medicaments and biological substances status: Secondary | ICD-10-CM | POA: Diagnosis not present

## 2018-10-19 DIAGNOSIS — R109 Unspecified abdominal pain: Secondary | ICD-10-CM | POA: Diagnosis not present

## 2018-10-19 DIAGNOSIS — Z7902 Long term (current) use of antithrombotics/antiplatelets: Secondary | ICD-10-CM | POA: Diagnosis not present

## 2018-10-19 DIAGNOSIS — Z87891 Personal history of nicotine dependence: Secondary | ICD-10-CM | POA: Diagnosis not present

## 2018-10-19 DIAGNOSIS — K5190 Ulcerative colitis, unspecified, without complications: Secondary | ICD-10-CM | POA: Diagnosis not present

## 2018-10-19 DIAGNOSIS — G4700 Insomnia, unspecified: Secondary | ICD-10-CM | POA: Diagnosis not present

## 2018-10-19 DIAGNOSIS — I441 Atrioventricular block, second degree: Secondary | ICD-10-CM | POA: Diagnosis not present

## 2018-10-19 DIAGNOSIS — J439 Emphysema, unspecified: Secondary | ICD-10-CM | POA: Diagnosis not present

## 2018-10-19 DIAGNOSIS — K5100 Ulcerative (chronic) pancolitis without complications: Secondary | ICD-10-CM | POA: Diagnosis not present

## 2018-10-19 DIAGNOSIS — Z79899 Other long term (current) drug therapy: Secondary | ICD-10-CM | POA: Diagnosis not present

## 2018-10-19 DIAGNOSIS — I10 Essential (primary) hypertension: Secondary | ICD-10-CM | POA: Diagnosis not present

## 2018-10-19 DIAGNOSIS — R1011 Right upper quadrant pain: Secondary | ICD-10-CM | POA: Diagnosis not present

## 2018-10-19 DIAGNOSIS — K8020 Calculus of gallbladder without cholecystitis without obstruction: Secondary | ICD-10-CM | POA: Diagnosis not present

## 2018-10-19 DIAGNOSIS — K5730 Diverticulosis of large intestine without perforation or abscess without bleeding: Secondary | ICD-10-CM | POA: Diagnosis not present

## 2018-10-19 DIAGNOSIS — E876 Hypokalemia: Secondary | ICD-10-CM | POA: Diagnosis not present

## 2018-10-25 DIAGNOSIS — K5130 Ulcerative (chronic) rectosigmoiditis without complications: Secondary | ICD-10-CM | POA: Diagnosis not present

## 2018-10-29 DIAGNOSIS — G4700 Insomnia, unspecified: Secondary | ICD-10-CM | POA: Diagnosis not present

## 2018-10-29 DIAGNOSIS — K5190 Ulcerative colitis, unspecified, without complications: Secondary | ICD-10-CM | POA: Diagnosis not present

## 2018-11-27 DIAGNOSIS — K5130 Ulcerative (chronic) rectosigmoiditis without complications: Secondary | ICD-10-CM | POA: Diagnosis not present

## 2018-11-30 DIAGNOSIS — Z01818 Encounter for other preprocedural examination: Secondary | ICD-10-CM | POA: Diagnosis not present

## 2018-11-30 DIAGNOSIS — H2512 Age-related nuclear cataract, left eye: Secondary | ICD-10-CM | POA: Diagnosis not present

## 2018-11-30 DIAGNOSIS — H35372 Puckering of macula, left eye: Secondary | ICD-10-CM | POA: Diagnosis not present

## 2018-12-03 DIAGNOSIS — E538 Deficiency of other specified B group vitamins: Secondary | ICD-10-CM | POA: Diagnosis not present

## 2018-12-03 DIAGNOSIS — R002 Palpitations: Secondary | ICD-10-CM | POA: Diagnosis not present

## 2018-12-03 DIAGNOSIS — Z9181 History of falling: Secondary | ICD-10-CM | POA: Diagnosis not present

## 2018-12-18 DIAGNOSIS — I739 Peripheral vascular disease, unspecified: Secondary | ICD-10-CM | POA: Diagnosis not present

## 2018-12-18 DIAGNOSIS — H2511 Age-related nuclear cataract, right eye: Secondary | ICD-10-CM | POA: Diagnosis not present

## 2018-12-18 DIAGNOSIS — H35372 Puckering of macula, left eye: Secondary | ICD-10-CM | POA: Diagnosis not present

## 2018-12-18 DIAGNOSIS — H9190 Unspecified hearing loss, unspecified ear: Secondary | ICD-10-CM | POA: Diagnosis not present

## 2018-12-18 DIAGNOSIS — H259 Unspecified age-related cataract: Secondary | ICD-10-CM | POA: Diagnosis not present

## 2018-12-18 DIAGNOSIS — H2512 Age-related nuclear cataract, left eye: Secondary | ICD-10-CM | POA: Diagnosis not present

## 2018-12-18 DIAGNOSIS — J449 Chronic obstructive pulmonary disease, unspecified: Secondary | ICD-10-CM | POA: Diagnosis not present

## 2018-12-18 DIAGNOSIS — I10 Essential (primary) hypertension: Secondary | ICD-10-CM | POA: Diagnosis not present

## 2018-12-27 DIAGNOSIS — K5130 Ulcerative (chronic) rectosigmoiditis without complications: Secondary | ICD-10-CM | POA: Diagnosis not present

## 2019-01-01 DIAGNOSIS — J449 Chronic obstructive pulmonary disease, unspecified: Secondary | ICD-10-CM | POA: Diagnosis not present

## 2019-01-01 DIAGNOSIS — H2511 Age-related nuclear cataract, right eye: Secondary | ICD-10-CM | POA: Diagnosis not present

## 2019-01-01 DIAGNOSIS — Z7902 Long term (current) use of antithrombotics/antiplatelets: Secondary | ICD-10-CM | POA: Diagnosis not present

## 2019-01-01 DIAGNOSIS — I10 Essential (primary) hypertension: Secondary | ICD-10-CM | POA: Diagnosis not present

## 2019-01-01 DIAGNOSIS — E785 Hyperlipidemia, unspecified: Secondary | ICD-10-CM | POA: Diagnosis not present

## 2019-01-01 DIAGNOSIS — Z87891 Personal history of nicotine dependence: Secondary | ICD-10-CM | POA: Diagnosis not present

## 2019-01-01 DIAGNOSIS — Z79899 Other long term (current) drug therapy: Secondary | ICD-10-CM | POA: Diagnosis not present

## 2019-01-01 DIAGNOSIS — Z7982 Long term (current) use of aspirin: Secondary | ICD-10-CM | POA: Diagnosis not present

## 2019-01-01 DIAGNOSIS — H259 Unspecified age-related cataract: Secondary | ICD-10-CM | POA: Diagnosis not present

## 2019-01-01 DIAGNOSIS — H9190 Unspecified hearing loss, unspecified ear: Secondary | ICD-10-CM | POA: Diagnosis not present

## 2019-02-08 ENCOUNTER — Other Ambulatory Visit: Payer: Self-pay

## 2019-02-08 ENCOUNTER — Telehealth (HOSPITAL_COMMUNITY): Payer: Self-pay

## 2019-02-08 DIAGNOSIS — I779 Disorder of arteries and arterioles, unspecified: Secondary | ICD-10-CM

## 2019-02-08 NOTE — Telephone Encounter (Signed)

## 2019-02-11 ENCOUNTER — Encounter: Payer: Self-pay | Admitting: Family

## 2019-02-11 ENCOUNTER — Other Ambulatory Visit: Payer: Self-pay

## 2019-02-11 ENCOUNTER — Ambulatory Visit (INDEPENDENT_AMBULATORY_CARE_PROVIDER_SITE_OTHER): Payer: Medicare Other | Admitting: Family

## 2019-02-11 ENCOUNTER — Ambulatory Visit (HOSPITAL_COMMUNITY)
Admission: RE | Admit: 2019-02-11 | Discharge: 2019-02-11 | Disposition: A | Discharge status: Home / Self Care | Payer: Medicare Other | Source: Ambulatory Visit | Attending: Family | Admitting: Family

## 2019-02-11 ENCOUNTER — Ambulatory Visit (INDEPENDENT_AMBULATORY_CARE_PROVIDER_SITE_OTHER)
Admission: RE | Admit: 2019-02-11 | Discharge: 2019-02-11 | Disposition: A | Discharge status: Home / Self Care | Payer: Medicare Other | Source: Ambulatory Visit | Attending: Family | Admitting: Family

## 2019-02-11 VITALS — BP 155/81 | HR 74 | Temp 96.2°F | Resp 18 | Ht 70.0 in | Wt 200.0 lb

## 2019-02-11 DIAGNOSIS — Z87891 Personal history of nicotine dependence: Secondary | ICD-10-CM | POA: Diagnosis not present

## 2019-02-11 DIAGNOSIS — I779 Disorder of arteries and arterioles, unspecified: Secondary | ICD-10-CM

## 2019-02-11 NOTE — Patient Instructions (Signed)
Peripheral Vascular Disease  Peripheral vascular disease (PVD) is a disease of the blood vessels that are not part of your heart and brain. A simple term for PVD is poor circulation. In most cases, PVD narrows the blood vessels that carry blood from your heart to the rest of your body. This can reduce the supply of blood to your arms, legs, and internal organs, like your stomach or kidneys. However, PVD most often affects a person's lower legs and feet. Without treatment, PVD tends to get worse. PVD can also lead to acute ischemic limb. This is when an arm or leg suddenly cannot get enough blood. This is a medical emergency. Follow these instructions at home: Lifestyle  Do not use any products that contain nicotine or tobacco, such as cigarettes and e-cigarettes. If you need help quitting, ask your doctor.  Lose weight if you are overweight. Or, stay at a healthy weight as told by your doctor.  Eat a diet that is low in fat and cholesterol. If you need help, ask your doctor.  Exercise regularly. Ask your doctor for activities that are right for you. General instructions  Take over-the-counter and prescription medicines only as told by your doctor.  Take good care of your feet: ? Wear comfortable shoes that fit well. ? Check your feet often for any cuts or sores.  Keep all follow-up visits as told by your doctor This is important. Contact a doctor if:  You have cramps in your legs when you walk.  You have leg pain when you are at rest.  You have coldness in a leg or foot.  Your skin changes.  You are unable to get or have an erection (erectile dysfunction).  You have cuts or sores on your feet that do not heal. Get help right away if:  Your arm or leg turns cold, numb, and blue.  Your arms or legs become red, warm, swollen, painful, or numb.  You have chest pain.  You have trouble breathing.  You suddenly have weakness in your face, arm, or leg.  You become very  confused or you cannot speak.  You suddenly have a very bad headache.  You suddenly cannot see. Summary  Peripheral vascular disease (PVD) is a disease of the blood vessels.  A simple term for PVD is poor circulation. Without treatment, PVD tends to get worse.  Treatment may include exercise, low fat and low cholesterol diet, and quitting smoking. This information is not intended to replace advice given to you by your health care provider. Make sure you discuss any questions you have with your health care provider. Document Released: 08/03/2009 Document Revised: 04/21/2017 Document Reviewed: 06/16/2016 Elsevier Patient Education  2020 [employer]  

## 2019-02-11 NOTE — Progress Notes (Signed)
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 male who is s/pleft common and external iliac artery stentingon 10-30-17 by Dr. Mickeal Skinner occlusive disease. He has a previousleftfemoral-popliteal bypass with vein greater than 20 years ago by Dr. Hart [place].  He exercises in a gym 1-2 times/week. He has no tissue loss or ulceration.  He is excited to get back playing golf and walk more.  Dr. Randie Heinz last evaluated pt on 12-08-17. At that timeclaudication symptoms on the left seemedto have resolved;he hada palpable popliteal pulse distal to his bypass. He also hadvery strong signals in his left foot. Dr. Wyline Mood ptto continue walking and taking Plavix. Pt was tofollow-up in 3 months with repeat studies given the elevated velocities in his recent iliac artery duplex. If his symptoms remain unchanged at that timeDr. Cainwould not intervene.  After walking about 300 yards on an incline, he has burning in both of his upper thighs, lateral aspects, which resolves with more walking.   He has ulcerative colitis, was hospitalized about June 2020, lost 20 pounds, is taking prednisone.   Diabetic:No Tobacco OYD:XAJOIN smoker, quitin 1998, smoked 2.5 ppd x 46 years  Pt meds include: Statin :Yes Betablocker:No ASA:no Other anticoagulants/antiplatelets:Plavix    Past Medical History:  Diagnosis Date  . Claudication (HCC)   . COPD (chronic obstructive pulmonary disease) (HCC)   . Hypertension   . IBS (irritable bowel syndrome)   . Obese   . PAD (peripheral artery disease) (HCC)   . Pulmonary nodule   . Pure hypercholesterolemia     Social History Social History   Tobacco Use  . Smoking status: Former Smoker    Packs/day: 2.50    Years: 46.00    Pack years: 115.00    Types: Cigarettes    Quit date: 05/23/1996    Years since quitting: 22.7  . Smokeless  tobacco: Never Used  Substance Use Topics  . Alcohol use: Yes    Alcohol/week: 3.0 standard drinks    Types: 3 Cans of beer per week  . Drug use: No    Family History Family History  Problem Relation Age of Onset  . Dementia Father   . Dementia Mother     Past Surgical History:  Procedure Laterality Date  . ABDOMINAL AORTOGRAM W/LOWER EXTREMITY N/A 10/30/2017   Procedure: ABDOMINAL AORTOGRAM W/LOWER EXTREMITY;  Surgeon: Maeola Harman, MD;  Location: [facility] INVASIVE CV LAB;  Service: Cardiovascular;  Laterality: N/A;  . AORTA - ILIAC ARTERY BYPASS GRAFT    . PERIPHERAL VASCULAR INTERVENTION  10/30/2017   Procedure: PERIPHERAL VASCULAR INTERVENTION;  Surgeon: Maeola Harman, MD;  Location: [facility] INVASIVE CV LAB;  Service: Cardiovascular;;  LT Iliac    Allergies  Allergen Reactions  . Aleve [Naproxen Sodium]     Due to blood pressure  . Ibuprofen Diarrhea    Current Outpatient Medications  Medication Sig Dispense Refill  . amLODipine (NORVASC) 5 MG tablet Take 1 tablet by mouth daily.    Marland Kitchen atorvastatin (LIPITOR) 10 MG tablet Take 10 mg by mouth at bedtime.  5  . clopidogrel (PLAVIX) 75 MG tablet Take 75 mg by mouth daily.     Marland Kitchen COLY-MYCIN S 3.07-23-08-0.5 MG/ML OTIC suspension Place 4 drops into both ears daily as needed.  0  . Cyanocobalamin (VITAMIN B-12 IJ) Inject as directed every 30 (thirty) days.    . Fluticasone-Salmeterol (ADVAIR DISKUS) 250-50 MCG/DOSE AEPB  Inhale 1 puff into the lungs 2 (two) times daily. 60 each 11  . hydrochlorothiazide (HYDRODIURIL) 12.5 MG tablet Take 1 tablet by mouth daily.    . Multiple Vitamin (MULTIVITAMIN) tablet Take 1 tablet by mouth daily.    . traMADol (ULTRAM) 50 MG tablet Take 1 tablet (50 mg total) by mouth every 6 (six) hours as needed. 20 tablet 0  . WIXELA INHUB 100-50 MCG/DOSE AEPB Inhale 1 puff into the lungs 2 (two) times daily.  3   No current facility-administered medications for this visit.     ROS: See HPI  for pertinent positives and negatives.   Physical Examination  Vitals:   02/11/19 1100  BP: (!) 155/81  Pulse: 74  Resp: 18  Temp: (!) 96.2 F (35.7 C)  TempSrc: Temporal  SpO2: 96%  Weight: 200 lb (90.7 kg)  Height: 5\' 10"  (1.778 m)   Body mass index is 28.7 kg/m.  General: A&O x 3, WDWN, male in NAD. Gait: normal HEET: No gross abnormalities.  Pulmonary: Respirations are non labored, fair air movement in all fields, no rales, rhonchi, or wheezes Cardiac: regular rhythm, no detected murmur.         Carotid Bruits Right Left   Negative Negative   Radial pulses: not palpbale right, 2+ palpable left, right brachial pulse is 2+ palpable (pt denies tingling, numbness, or pain in right UE) Adominal aortic pulse is not palpable                         VASCULAR EXAM: Extremities without ischemic changes, without Gangrene; without open wounds.                                                                                                          LE Pulses Right Left       FEMORAL  not palpable  not palpable        POPLITEAL  not palpable   not palpable       POSTERIOR TIBIAL  not palpable   not palpable        DORSALIS PEDIS      ANTERIOR TIBIAL not palpable  not palpable    Abdomen: soft, NT, no palpable masses. Skin: no rashes, no cellulitis, no ulcers noted. Musculoskeletal: no muscle wasting or atrophy.  Neurologic: A&O X 3; appropriate affect, Sensation is normal; MOTOR FUNCTION:  moving all extremities equally, motor strength 5/5 throughout. Speech is fluent/normal. CN 2-12 intact except some hearing loss Psychiatric: Thought content is normal, mood appropriate for clinical situation.     DATA  Aortoiliac Duplex (02-11-19): Abdominal Aorta Findings: +-------------+-------+----------+----------+----------+--------+--------+ Location     AP (cm)Trans (cm)PSV (cm/s)Waveform   ThrombusComments +-------------+-------+----------+----------+----------+--------+--------+ Mid          3.46   2.73      57                                   +-------------+-------+----------+----------+----------+--------+--------+ Distal  122                                  +-------------+-------+----------+----------+----------+--------+--------+ RT EIA Prox                   96        monophasic                 +-------------+-------+----------+----------+----------+--------+--------+ RT EIA Mid                    170       monophasic                 +-------------+-------+----------+----------+----------+--------+--------+ RT EIA Distal                 155       biphasic                   +-------------+-------+----------+----------+----------+--------+--------+ LT CIA Prox                   93        biphasic          stent    +-------------+-------+----------+----------+----------+--------+--------+ LT CIA Mid                    79        biphasic          stent    +-------------+-------+----------+----------+----------+--------+--------+ LT CIA Distal                 149       biphasic          stent    +-------------+-------+----------+----------+----------+--------+--------+  Right internal iliac artery peak systolic velocity peak systolic velocity 299 cm/sec. Limited duplex of the left fem-pop bypass graft showed a patent graft with biphasic waveforms and no evidence stenosis.    Right Stent(s): +-------------------+--------+--------+----------+--------+ common iliac arteryPSV cm/sStenosisWaveform  Comments +-------------------+--------+--------+----------+--------+ Proximal Stent     193             biphasic           +-------------------+--------+--------+----------+--------+ Mid Stent          155             biphasic            +-------------------+--------+--------+----------+--------+ Distal Stent       187             monophasic         +-------------------+--------+--------+----------+--------+  Left Stent(s): +---------------------+--------+--------+--------+--------+ external iliac arteryPSV cm/sStenosisWaveformComments +---------------------+--------+--------+--------+--------+ Prox to Stent        168             biphasic         +---------------------+--------+--------+--------+--------+ Proximal Stent       145             biphasic         +---------------------+--------+--------+--------+--------+ Mid Stent            166             biphasic         +---------------------+--------+--------+--------+--------+ Distal Stent         131             biphasic         +---------------------+--------+--------+--------+--------+ Distal to Stent      127  biphasic         +---------------------+--------+--------+--------+--------+   Summary: Stenosis: +-------------------+-----------+ Location           Stent       +-------------------+-----------+ Right Common Iliac no stenosis +-------------------+-----------+ Left Common Iliac  no stenosis +-------------------+-----------+ Left External Iliacno stenosis +-------------------+-----------+ Patent left fem-pop bypass graft.    Aortoiliac Duplex (08-06-18): Abdominal Aorta Findings: +-------------+-------+----------+----------+----------+--------+--------+ Location AP (cm)Trans (cm)PSV (cm/s)Waveform ThrombusComments +-------------+-------+----------+----------+----------+--------+--------+ Proximal   50 monophasic   +-------------+-------+----------+----------+----------+--------+--------+ Mid   67 monophasic    +-------------+-------+----------+----------+----------+--------+--------+ Distal   72 monophasic   +-------------+-------+----------+----------+----------+--------+--------+ RT CIA Prox   231 monophasic   +-------------+-------+----------+----------+----------+--------+--------+ RT CIA Mid   123 monophasic   +-------------+-------+----------+----------+----------+--------+--------+ RT CIA Distal  157 monophasic   +-------------+-------+----------+----------+----------+--------+--------+ RT EIA Prox   232 monophasic   +-------------+-------+----------+----------+----------+--------+--------+ RT EIA Mid   185 monophasic   +-------------+-------+----------+----------+----------+--------+--------+ RT EIA Distal  179 monophasic   +-------------+-------+----------+----------+----------+--------+--------+ LT CIA Prox   165 monophasic   +-------------+-------+----------+----------+----------+--------+--------+ LT CIA Mid   168 monophasic   +-------------+-------+----------+----------+----------+--------+--------+ LT CIA Distal  83 monophasic   +-------------+-------+----------+----------+----------+--------+--------+ LT EIA Prox   205 monophasic   +-------------+-------+----------+----------+----------+--------+--------+ LT EIA Mid   137 monophasic   +-------------+-------+----------+----------+----------+--------+--------+ LT EIA Distal  271  monophasic   +-------------+-------+----------+----------+----------+--------+--------+ Doppler waveforms are monophasic however with sharp systolic upstroke.  Summary: Stenosis: Bilateral stent walls not visualized, however stents appear patent throughout their entirety. Elevated velocities noted in the right proximal common and external iliac arteries and the left proximal and distal external iliac artery, consistent  with a >50% stenosis.    ABI Findings (02-11-19): +---------+------------------+-----+---------+--------+ Right    Rt Pressure (mmHg)IndexWaveform Comment  +---------+------------------+-----+---------+--------+ Brachial 147                                      +---------+------------------+-----+---------+--------+ ATA      117               0.77 biphasic          +---------+------------------+-----+---------+--------+ PTA      153               1.01 triphasic         +---------+------------------+-----+---------+--------+ Great Toe86                0.57                   +---------+------------------+-----+---------+--------+ +---------+------------------+-----+---------+-------+ Left     Lt Pressure (mmHg)IndexWaveform Comment +---------+------------------+-----+---------+-------+ Brachial 152                                     +---------+------------------+-----+---------+-------+ ATA      177               1.16 biphasic         +---------+------------------+-----+---------+-------+ PTA      193               1.27 triphasic        +---------+------------------+-----+---------+-------+ Kerby NoraGreat Toe117               0.77                  +---------+------------------+-----+---------+-------+ +-------+-----------+-----------+------------+------------+ ABI/TBIToday's ABIToday's TBIPrevious ABIPrevious TBI +-------+-----------+-----------+------------+------------+ Right   1.01       0.57       0.98  0.54         +-------+-----------+-----------+------------+------------+ Left   1.27       0.77       1.15        0.79         +-------+-----------+-----------+------------+------------+ Bilateral ABIs appear essentially unchanged compared to prior study on 08/06/2018.   Summary: Right: Resting right ankle-brachial index is within normal range. No evidence of significant right lower extremity arterial disease. The right toe-brachial index is abnormal. RT great toe pressure = 86 mmHg.  Left: Resting left ankle-brachial index is within normal range. No evidence of significant left lower extremity arterial disease. The left toe-brachial index is normal. LT Great toe pressure = 117 mmHg.   ABI (Date: 08/06/2018): +---------+------------------+-----+----------+--------+ Right Rt Pressure (mmHg)IndexWaveform Comment  +---------+------------------+-----+----------+--------+ Brachial 140     +---------+------------------+-----+----------+--------+ PTA 137 0.98 biphasic   +---------+------------------+-----+----------+--------+ DP 112 0.80 monophasic  +---------+------------------+-----+----------+--------+ Great Toe75 0.54    +---------+------------------+-----+----------+--------+  +---------+------------------+-----+---------+-------+ Left Lt Pressure (mmHg)IndexWaveform Comment +---------+------------------+-----+---------+-------+ Brachial 138     +---------+------------------+-----+---------+-------+ PTA 161 1.15 triphasic  +---------+------------------+-----+---------+-------+ DP 116 0.83 triphasic  +---------+------------------+-----+---------+-------+ Great Toe111  0.79    +---------+------------------+-----+---------+-------+  +-------+-----------+-----------+------------+------------+ ABI/TBIToday's ABIToday's TBIPrevious ABIPrevious TBI +-------+-----------+-----------+------------+------------+ Right 0.98 0.54 1.01 0.75  +-------+-----------+-----------+------------+------------+ Left 1.15 0.79 1.10 0.78  +-------+-----------+-----------+------------+------------+  Bilateral ABIs appear essentially unchanged compared to prior study on 05/03/2018. Right TBIs appear decreased compared to prior study on 05/03/2018.  Summary: Right: Resting right ankle-brachial index is within normal range. No evidence of significant right lower extremity arterial disease. The right toe-brachial index is abnormal. RT great toe pressure = 75 mmHg.  Left: Resting left ankle-brachial index is within normal range. No evidence of significant left lower extremity arterial disease. The left toe-brachial index is normal. LT Great toe pressure = 111 mmHg.    ASSESSMENT: CYLIS AYARS is a 78 y.o. male who is s/pleft common and external iliac artery stentingon 10-30-17 by Dr. Mickeal Skinner occlusive disease.  He has a previous left femoral-popliteal bypass with vein greater than 20 years ago.  He exercises his legs 1-2 days/week in a gym, but states he has decreased his walking.   He has been set back earlier this year with ulcerative colitis which required hospitalization, he is taking lifelong prednisone for this.   Bilateral ABI's remain normal with bi and triphasic waveforms.  Duplex today: bilateral iliac artery stents and left fem-pop bypass graft. With no stenosis.   His atherosclerotic risk factors include 46 year hx of smoking 2.5 ppd, quit in 1998. Fortunately he does not have DM. He takes a daily statin and Plavix.   12-08-17:Left external iliac  artery has velocities greater than 250cm/sconsistent with greater than 50% stenosis distal to the stent.    PLAN:  Based on the patient's vascular studies and examination, pt will return to clinic in 9 months with ABI's and left aortoiliac duplex.  I advised pt to notify us if he develops concerns re the circulation in his feet or legs.   Walk a total of at least 30 minutes daily in a safe environment.    I discussed in depth with the patient the nature of atherosclerosis, and emphasized the importance of maximal medical management including strict control of blood pressure, blood glucose, and lipid levels, obtaining regular exercise, and continued cessation of smoking.  The patient is aware that without maximal medical management the underlying atherosclerotic disease process will progress, limiting the benefit of any interventions.  The patient was given information about PAD including signs,  symptoms, treatment, what symptoms should prompt the patient to seek immediate medical care, and risk reduction measures to take.  Charisse MarchSuzanne , RN, MSN, FNP-C [facility] [place] Office Phone: [phone]  Clinic MD: Myra GianottiBrabham  02/11/19 11:23 AM

## 2019-03-04 DIAGNOSIS — Z23 Encounter for immunization: Secondary | ICD-10-CM | POA: Diagnosis not present

## 2019-03-04 DIAGNOSIS — Z0000 Encounter for general adult medical examination without abnormal findings: Secondary | ICD-10-CM | POA: Diagnosis not present

## 2019-04-02 DIAGNOSIS — D519 Vitamin B12 deficiency anemia, unspecified: Secondary | ICD-10-CM | POA: Diagnosis not present

## 2019-04-02 DIAGNOSIS — K5130 Ulcerative (chronic) rectosigmoiditis without complications: Secondary | ICD-10-CM | POA: Diagnosis not present

## 2019-04-03 DIAGNOSIS — R195 Other fecal abnormalities: Secondary | ICD-10-CM | POA: Diagnosis not present

## 2019-04-16 DIAGNOSIS — K5130 Ulcerative (chronic) rectosigmoiditis without complications: Secondary | ICD-10-CM | POA: Diagnosis not present

## 2019-05-08 DIAGNOSIS — H04129 Dry eye syndrome of unspecified lacrimal gland: Secondary | ICD-10-CM | POA: Diagnosis not present

## 2019-05-08 DIAGNOSIS — H0102A Squamous blepharitis right eye, upper and lower eyelids: Secondary | ICD-10-CM | POA: Diagnosis not present

## 2019-05-08 DIAGNOSIS — H0102B Squamous blepharitis left eye, upper and lower eyelids: Secondary | ICD-10-CM | POA: Diagnosis not present

## 2019-05-30 DIAGNOSIS — H35372 Puckering of macula, left eye: Secondary | ICD-10-CM | POA: Diagnosis not present

## 2019-06-05 DIAGNOSIS — E538 Deficiency of other specified B group vitamins: Secondary | ICD-10-CM | POA: Diagnosis not present

## 2019-07-06 DIAGNOSIS — S91342A Puncture wound with foreign body, left foot, initial encounter: Secondary | ICD-10-CM | POA: Diagnosis not present

## 2019-07-08 DIAGNOSIS — S99929A Unspecified injury of unspecified foot, initial encounter: Secondary | ICD-10-CM | POA: Diagnosis not present

## 2019-07-08 DIAGNOSIS — I10 Essential (primary) hypertension: Secondary | ICD-10-CM | POA: Diagnosis not present

## 2019-08-20 DIAGNOSIS — K5130 Ulcerative (chronic) rectosigmoiditis without complications: Secondary | ICD-10-CM | POA: Diagnosis not present

## 2019-08-20 DIAGNOSIS — R1031 Right lower quadrant pain: Secondary | ICD-10-CM | POA: Diagnosis not present

## 2019-08-20 DIAGNOSIS — R1032 Left lower quadrant pain: Secondary | ICD-10-CM | POA: Diagnosis not present

## 2019-08-20 DIAGNOSIS — L290 Pruritus ani: Secondary | ICD-10-CM | POA: Diagnosis not present

## 2019-08-22 DIAGNOSIS — K5130 Ulcerative (chronic) rectosigmoiditis without complications: Secondary | ICD-10-CM | POA: Diagnosis not present

## 2019-09-03 DIAGNOSIS — D519 Vitamin B12 deficiency anemia, unspecified: Secondary | ICD-10-CM | POA: Diagnosis not present

## 2019-09-16 DIAGNOSIS — L304 Erythema intertrigo: Secondary | ICD-10-CM | POA: Diagnosis not present

## 2019-12-11 DIAGNOSIS — D519 Vitamin B12 deficiency anemia, unspecified: Secondary | ICD-10-CM | POA: Diagnosis not present

## 2019-12-16 ENCOUNTER — Encounter (HOSPITAL_COMMUNITY): Payer: Medicare Other

## 2019-12-16 ENCOUNTER — Ambulatory Visit: Payer: Medicare Other

## 2019-12-25 ENCOUNTER — Other Ambulatory Visit: Payer: Self-pay

## 2019-12-25 DIAGNOSIS — I739 Peripheral vascular disease, unspecified: Secondary | ICD-10-CM

## 2019-12-25 DIAGNOSIS — I779 Disorder of arteries and arterioles, unspecified: Secondary | ICD-10-CM

## 2020-01-07 ENCOUNTER — Ambulatory Visit (HOSPITAL_COMMUNITY)
Admission: RE | Admit: 2020-01-07 | Discharge: 2020-01-07 | Disposition: A | Discharge status: Home / Self Care | Payer: Medicare Other | Source: Ambulatory Visit | Attending: Vascular Surgery | Admitting: Vascular Surgery

## 2020-01-07 ENCOUNTER — Ambulatory Visit (INDEPENDENT_AMBULATORY_CARE_PROVIDER_SITE_OTHER): Payer: Medicare Other | Admitting: Physician Assistant

## 2020-01-07 ENCOUNTER — Other Ambulatory Visit: Payer: Self-pay

## 2020-01-07 ENCOUNTER — Ambulatory Visit (INDEPENDENT_AMBULATORY_CARE_PROVIDER_SITE_OTHER)
Admission: RE | Admit: 2020-01-07 | Discharge: 2020-01-07 | Disposition: A | Discharge status: Home / Self Care | Payer: Medicare Other | Source: Ambulatory Visit | Attending: Vascular Surgery | Admitting: Vascular Surgery

## 2020-01-07 VITALS — BP 140/87 | HR 71 | Temp 98.2°F | Resp 20 | Ht 70.0 in | Wt 205.0 lb

## 2020-01-07 DIAGNOSIS — I739 Peripheral vascular disease, unspecified: Secondary | ICD-10-CM | POA: Diagnosis not present

## 2020-01-07 DIAGNOSIS — I779 Disorder of arteries and arterioles, unspecified: Secondary | ICD-10-CM | POA: Diagnosis not present

## 2020-01-07 NOTE — Progress Notes (Signed)
History of Present Illness:  Patient is a 79 y.o. year old male who presents for evaluation of claudication.  s/pleft common and external iliac artery stentingon 10-30-17 by Dr. Mickeal Skinner occlusive disease. He has a previousleftfemoral-popliteal bypass with vein greater than 20 years ago by Dr. Hart Rochester and previous right CIA stent in 2011.    He denise new symptoms of claudication and has stable symptoms of hip claudication only when going up hill.  He stays very active and goes to the gym 2 times a week as well as plays golf frequently.Marland Kitchen He has not had return of pre left stent claudication.  He can walk as far as he wants on flat surfaces.    He denise non healing wounds and rest pain in B LE. He continues to take daily Plavix and Lipitor.    Past Medical History:  Diagnosis Date  . Claudication (HCC)   . COPD (chronic obstructive pulmonary disease) (HCC)   . Hypertension   . IBS (irritable bowel syndrome)   . Obese   . PAD (peripheral artery disease) (HCC)   . Pulmonary nodule   . Pure hypercholesterolemia     Past Surgical History:  Procedure Laterality Date  . ABDOMINAL AORTOGRAM W/LOWER EXTREMITY N/A 10/30/2017   Procedure: ABDOMINAL AORTOGRAM W/LOWER EXTREMITY;  Surgeon: Maeola Harman, MD;  Location: [facility] INVASIVE CV LAB;  Service: Cardiovascular;  Laterality: N/A;  . AORTA - ILIAC ARTERY BYPASS GRAFT    . PERIPHERAL VASCULAR INTERVENTION  10/30/2017   Procedure: PERIPHERAL VASCULAR INTERVENTION;  Surgeon: Maeola Harman, MD;  Location: [facility] INVASIVE CV LAB;  Service: Cardiovascular;;  LT Iliac    ROS:   General:  No weight loss, Fever, chills  HEENT: No recent headaches, no nasal bleeding, no visual changes, no sore throat  Neurologic: No dizziness, blackouts, seizures. No recent symptoms of stroke or mini- stroke. No recent episodes of slurred speech, or temporary blindness.  Cardiac: No recent episodes of chest pain/pressure, no shortness of  breath at rest.  No shortness of breath with exertion.  Denies history of atrial fibrillation or irregular heartbeat  Vascular: No history of rest pain in feet.  No history of claudication.  No history of non-healing ulcer, No history of DVT   Pulmonary: No home oxygen, no productive cough, no hemoptysis,  No asthma or wheezing  Musculoskeletal:  [ ]  Arthritis, [ ]  Low back pain,  x ] Joint pain  Hematologic:No history of hypercoagulable state.  No history of easy bleeding.  No history of anemia  Gastrointestinal: No hematochezia or melena,  No gastroesophageal reflux, no trouble swallowing  Urinary: [ ]  chronic Kidney disease, [ ]  on HD - [ ]  MWF or [ ]  TTHS, [ ]  Burning with urination, [ ]  Frequent urination, [ ]  Difficulty urinating;   Skin: No rashes  Psychological: No history of anxiety,  No history of depression  Social History Social History   Tobacco Use  . Smoking status: Former Smoker    Packs/day: 2.50    Years: 46.00    Pack years: 115.00    Types: Cigarettes    Quit date: 05/23/1996    Years since quitting: 23.6  . Smokeless tobacco: Never Used  Vaping Use  . Vaping Use: Never used  Substance Use Topics  . Alcohol use: Yes    Alcohol/week: 3.0 standard drinks    Types: 3 Cans of beer per week  . Drug use: No    Family History Family History  Problem Relation Age of Onset  . Dementia Father   . Dementia Mother     Allergies  Allergies  Allergen Reactions  . Aleve [Naproxen Sodium]     Due to blood pressure  . Ibuprofen Diarrhea     Current Outpatient Medications  Medication Sig Dispense Refill  . amLODipine (NORVASC) 5 MG tablet Take 1 tablet by mouth daily.    Marland Kitchen atorvastatin (LIPITOR) 10 MG tablet Take 10 mg by mouth at bedtime.  5  . clopidogrel (PLAVIX) 75 MG tablet Take 75 mg by mouth daily.     Marland Kitchen COLY-MYCIN S 3.07-23-08-0.5 MG/ML OTIC suspension Place 4 drops into both ears daily as needed.  0  . Cyanocobalamin (VITAMIN B-12 IJ) Inject as  directed every 30 (thirty) days.    . Fluticasone-Salmeterol (ADVAIR DISKUS) 250-50 MCG/DOSE AEPB Inhale 1 puff into the lungs 2 (two) times daily. 60 each 11  . hydrochlorothiazide (HYDRODIURIL) 12.5 MG tablet Take 1 tablet by mouth daily.    . mesalamine (LIALDA) 1.2 g EC tablet Take 4.8 g by mouth every morning.    . Multiple Vitamin (MULTIVITAMIN) tablet Take 1 tablet by mouth daily.    . traMADol (ULTRAM) 50 MG tablet Take 1 tablet (50 mg total) by mouth every 6 (six) hours as needed. 20 tablet 0  . WIXELA INHUB 100-50 MCG/DOSE AEPB Inhale 1 puff into the lungs 2 (two) times daily.  3   No current facility-administered medications for this visit.    Physical Examination  Vitals:   01/07/20 1033  BP: 140/87  Pulse: 71  Resp: 20  Temp: 98.2 F (36.8 C)  TempSrc: Temporal  SpO2: 95%  Weight: 205 lb (93 kg)  Height: 5\' 10"  (1.778 m)    Body mass index is 29.41 kg/m.  General:  Alert and oriented, no acute distress HEENT: Normal Neck: No bruit or JVD Pulmonary: Clear to auscultation bilaterally Cardiac: Regular Rate and Rhythm without murmur Abdomen: Soft, non-tender, non-distended, no mass, no scars Skin: No rash Extremity Pulses:  2+ radial, brachial, femoral, doppler dorsalis pedis, posterior tibial bilaterally Musculoskeletal: No deformity or edema  Neurologic: Upper and lower extremity motor 5/5 and symmetric  DATA:    ABI Findings:  +---------+------------------+-----+----------+--------+  Right  Rt Pressure (mmHg)IndexWaveform Comment   +---------+------------------+-----+----------+--------+  Brachial 149                      +---------+------------------+-----+----------+--------+  PTA   165        1.10 monophasic      +---------+------------------+-----+----------+--------+  DP    157        1.05 monophasic      +---------+------------------+-----+----------+--------+  Great  Toe101        0.67 Abnormal       +---------+------------------+-----+----------+--------+   +---------+------------------+-----+----------+-------+  Left   Lt Pressure (mmHg)IndexWaveform Comment  +---------+------------------+-----+----------+-------+  Brachial 150                      +---------+------------------+-----+----------+-------+  PTA   >254       1.69 triphasic       +---------+------------------+-----+----------+-------+  DP    178        1.19 monophasic      +---------+------------------+-----+----------+-------+  Great Toe97        0.65 Abnormal       +---------+------------------+-----+----------+-------+   +-------+-----------+-----------+------------+------------+  ABI/TBIToday's ABIToday's TBIPrevious ABIPrevious TBI  +-------+-----------+-----------+------------+------------+  Right 1.10    0.67    1.01  0.57      +-------+-----------+-----------+------------+------------+  Left  [place]     0.65    1.27    0.77      +-------+-----------+-----------+------------+------------+       Summary:  Right: Although resting right ankle-brachial index appears to be within  normal range. monophasic waveforms are noted , suggestive of possible  calcified arteries. The right toe-brachial index is abnormal.   Left: Resting left ankle-brachial index indicates noncompressible left  lower extremity arteries. The left toe-brachial index is abnormal.   Abdominal Aorta Findings:  +-------------+-------+----------+----------+----------+--------+----------  ----+  Location   AP (cm)Trans (cm)PSV (cm/s)Waveform ThrombusComments      +-------------+-------+----------+----------+----------+--------+----------  ----+  RT CIA Prox                        Not  visualized    +-------------+-------+----------+----------+----------+--------+----------  ----+  RT EIA Mid           174    monophasic              +-------------+-------+----------+----------+----------+--------+----------  ----+  RT EIA Distal         118    monophasic              +-------------+-------+----------+----------+----------+--------+----------  ----+  LT CIA Prox                        Not  visualized  +-------------+-------+----------+----------+----------+--------+----------  ----+  LT EIA Distal         317    biphasic     dampened      +-------------+-------+----------+----------+----------+--------+----------  ----+       Right Stent(s):  +---------------+--------+--------+----------+--------+  CIA stent   PSV cm/sStenosisWaveform Comments  +---------------+--------+--------+----------+--------+  Prox to Stent 216       monophasic      +---------------+--------+--------+----------+--------+  Proximal Stent              NV     +---------------+--------+--------+----------+--------+  Mid Stent                NV     +---------------+--------+--------+----------+--------+  Distal Stent  137       monophasicbrisk    +---------------+--------+--------+----------+--------+  Distal to Stent             NV     +---------------+--------+--------+----------+--------+           Left Stent(s):  +---------------+---++--------+---+  Prox to Stent       NV   +---------------+---++--------+---+  Proximal Stent 68     NWv  +---------------+---++--------+---+  Mid Stent   177biphasic    +---------------+---++--------+---+  Distal Stent  164biphasic    +---------------+---++--------+---+  Distal to  Stent      NV   +---------------+---++--------+---+   Right CFA monophasic waveform 101 cm/s. Left CFA biphasic waveform 76.3  cm/s     Summary:  IVC/Iliac: Suboptimal exam. Unable to thoroughly visualize stents as well  as Iliac arteries, Other imaging modalities may be warranted. Probable >  50% stenosis in the left distal external iliac artery.     ASSESSMENT:  Recent Left common and ext. Iliac stents for claudication symptoms.  History of right common iliac stent and left LE bypass by Dr. Hart Rochester 20 years ago.  He has not had return of symptoms in the left LE of claudication.  He can walk as far as he likes on flat ground.    The duplex of the left EXT iliac stent suggested  50% stenosis.   Patent right iliac stents and patent left LE bypass with stable ABI's and brisk doppler signals on exam.   PLAN:  I will bring him back in 6 months for repeat Aortoiliac duplex and ABI's.  If he develops increased symptoms of claudication sooner he will call.     Mosetta Pigeon PA-C [facility] Office: [phone]  MD in clinic [place]

## 2020-01-09 ENCOUNTER — Other Ambulatory Visit: Payer: Self-pay | Admitting: *Deleted

## 2020-01-09 DIAGNOSIS — I779 Disorder of arteries and arterioles, unspecified: Secondary | ICD-10-CM

## 2020-01-09 DIAGNOSIS — I739 Peripheral vascular disease, unspecified: Secondary | ICD-10-CM

## 2020-02-25 DIAGNOSIS — E538 Deficiency of other specified B group vitamins: Secondary | ICD-10-CM | POA: Diagnosis not present

## 2020-03-13 DIAGNOSIS — R011 Cardiac murmur, unspecified: Secondary | ICD-10-CM | POA: Diagnosis not present

## 2020-03-13 DIAGNOSIS — I499 Cardiac arrhythmia, unspecified: Secondary | ICD-10-CM | POA: Diagnosis not present

## 2020-03-13 DIAGNOSIS — M5410 Radiculopathy, site unspecified: Secondary | ICD-10-CM | POA: Diagnosis not present

## 2020-03-13 DIAGNOSIS — M542 Cervicalgia: Secondary | ICD-10-CM | POA: Diagnosis not present

## 2020-03-13 DIAGNOSIS — Z23 Encounter for immunization: Secondary | ICD-10-CM | POA: Diagnosis not present

## 2020-03-19 ENCOUNTER — Encounter: Payer: Self-pay | Admitting: Cardiology

## 2020-03-26 DIAGNOSIS — I6523 Occlusion and stenosis of bilateral carotid arteries: Secondary | ICD-10-CM | POA: Diagnosis not present

## 2020-03-26 DIAGNOSIS — I6503 Occlusion and stenosis of bilateral vertebral arteries: Secondary | ICD-10-CM | POA: Diagnosis not present

## 2020-03-27 DIAGNOSIS — Z1211 Encounter for screening for malignant neoplasm of colon: Secondary | ICD-10-CM | POA: Diagnosis not present

## 2020-03-31 ENCOUNTER — Ambulatory Visit: Payer: Medicare Other | Admitting: Cardiology

## 2020-04-07 DIAGNOSIS — E538 Deficiency of other specified B group vitamins: Secondary | ICD-10-CM | POA: Diagnosis not present

## 2020-04-10 DIAGNOSIS — E538 Deficiency of other specified B group vitamins: Secondary | ICD-10-CM | POA: Insufficient documentation

## 2020-04-10 DIAGNOSIS — I739 Peripheral vascular disease, unspecified: Secondary | ICD-10-CM | POA: Insufficient documentation

## 2020-04-10 DIAGNOSIS — E291 Testicular hypofunction: Secondary | ICD-10-CM | POA: Insufficient documentation

## 2020-04-10 DIAGNOSIS — K5190 Ulcerative colitis, unspecified, without complications: Secondary | ICD-10-CM | POA: Insufficient documentation

## 2020-04-10 DIAGNOSIS — E669 Obesity, unspecified: Secondary | ICD-10-CM | POA: Insufficient documentation

## 2020-04-10 DIAGNOSIS — K589 Irritable bowel syndrome without diarrhea: Secondary | ICD-10-CM | POA: Insufficient documentation

## 2020-04-10 DIAGNOSIS — M1990 Unspecified osteoarthritis, unspecified site: Secondary | ICD-10-CM | POA: Insufficient documentation

## 2020-04-10 DIAGNOSIS — J449 Chronic obstructive pulmonary disease, unspecified: Secondary | ICD-10-CM | POA: Insufficient documentation

## 2020-04-19 NOTE — Progress Notes (Signed)
Cardiology Office Note:    Date:  04/20/2020   ID:  Derek [name], DOB November 12, 1940, MRN [id number]  PCP:  Noni Saupe, MD  Cardiologist:  Norman Herrlich, MD   Referring MD: Vertell Novak*  ASSESSMENT:    1. APC (atrial premature contractions)   2. Chaotic atrial rhythm   3. Coronary artery calcification seen on CT scan   4. Calcification of aortic valve   5. Pure hypercholesterolemia    PLAN:    In order of problems listed above:  1. He has atrial arrhythmia frequent APCs and chaotic atrial rhythm in the setting of COPD and bronchodilator usage.  He is asymptomatic at this time I would not pursue event monitors.  If he developed multifocal atrial tachycardia we could utilize a low-dose beta-blocker rate limiting calcium channel blocker. 2. He is on a statin I would continue with vascular calcification at this time I do not think he requires an ischemia evaluation he is having no chest pain or anginal symptoms 3. Uncommon on CT suggest significant aortic valve disease and although physical examination is unremarkable findings of aortic stenosis can be masked by COPD echocardiogram was ordered and if he had severe AS would benefit from intervention particularly TAVR 4. Stable continue with statin  Next appointment 6 weeks   Medication Adjustments/Labs and Tests Ordered: Current medicines are reviewed at length with the patient today.  Concerns regarding medicines are outlined above.  No orders of the defined types were placed in this encounter.  No orders of the defined types were placed in this encounter.    Chief Complaint  Patient presents with  . Heart Murmur  . Irregular Heart Beat    History of Present Illness:    Derek [name] is a 79 y.o. male with a history of hypertension hyperlipidemia ulcerative colitis and peripheral arterial disease with previous lower extremity revascularization who is being seen today for the evaluation of irregular  heartbeat at the request of Maurie Boettcher T, F*.  I reviewed records from his primary care physician he was seen 03/16/2020 initially concern for atrial fibrillation but on reviewing the strips he has frequent atrial premature contractions with a chaotic atrial rhythm.  His other significant history of COPD.  Recent labs 03/15/2020 with his PCP: Magnesium normal 1.9 Hemoglobin 13.8 hematocrit 41 platelet count 337,000.  His white count was elevated at 12,000 CMP showed GFR greater than 60 cc creatinine 0.7 potassium 4.2 sodium 138 normal liver function test Echocardiogram performed 08/17/2011 showed normal left ventricular size and function. He was seen by vascular surgery 01/07/2020 history of left common and external iliac artery stenting 10/30/2017 and a remote left femoral-popliteal bypass with vein graft more than 20 years ago and a right common internal artery stent 2011.  He has stable claudication.  He is a former smoker.  Most recent ABI right side 0.67 left 0.65. He has been seen previous in 2006 for pulmonary nodule. EKG 10/30/2017 showed sinus rhythm frequent APCs and frequent PVCs. CT chest 2016 showed extensive calcification of the aortic valve coronary arteries and thoracic aorta.  He says that he is here to see me for 2 reasons 1 is abnormal pulse second heart murmur that he said he has had his whole life.  He did not remember he had an echocardiogram in 2013.  He has intermittent shortness of breath but remains active and is limited by calf and thigh claudication.  He still golfs and does golf and  does garden work.  No chest pain edema orthopnea he has no awareness of his pulse he has had no syncope.  He has no known history of heart disease congenital or rheumatic. Past Medical History:  Diagnosis Date  . Androgen deficiency   . Arthritis   . Atherosclerosis of native arteries of the extremities with intermittent claudication 02/04/2014  . B12 deficiency   . Claudication  (HCC)   . COPD (chronic obstructive pulmonary disease) (HCC)   . COPD GOLD II     Pfts 07/15/13:  FeV1 1.46 (56%)  59% ratio 68   RV/TLC 145%  dlco  59%> 66%    . Degenerative arthritis   . Hypertension   . IBS (irritable bowel syndrome)   . Obese   . PAD (peripheral artery disease) (HCC)   . Pulmonary nodule   . Pure hypercholesterolemia   . Ulcerative colitis [facility])     Past Surgical History:  Procedure Laterality Date  . ABDOMINAL AORTOGRAM W/LOWER EXTREMITY N/A 10/30/2017   Procedure: ABDOMINAL AORTOGRAM W/LOWER EXTREMITY;  Surgeon: Maeola Harman, MD;  Location: [facility] INVASIVE CV LAB;  Service: Cardiovascular;  Laterality: N/A;  . AORTA - ILIAC ARTERY BYPASS GRAFT    . PERIPHERAL VASCULAR INTERVENTION  10/30/2017   Procedure: PERIPHERAL VASCULAR INTERVENTION;  Surgeon: Maeola Harman, MD;  Location: [facility] INVASIVE CV LAB;  Service: Cardiovascular;;  LT Iliac    Current Medications: Current Meds  Medication Sig  . amLODipine (NORVASC) 5 MG tablet Take 1 tablet by mouth daily.  Marland Kitchen atorvastatin (LIPITOR) 10 MG tablet Take 10 mg by mouth at bedtime.  . clopidogrel (PLAVIX) 75 MG tablet Take 75 mg by mouth daily.   . Cyanocobalamin (VITAMIN B-12 IJ) Inject 1,000 mcg as directed every 30 (thirty) days.   . Fluticasone-Salmeterol (ADVAIR DISKUS) 250-50 MCG/DOSE AEPB Inhale 1 puff into the lungs 2 (two) times daily.  . hydrochlorothiazide (HYDRODIURIL) 12.5 MG tablet Take 1 tablet by mouth daily.  . mesalamine (LIALDA) 1.2 g EC tablet Take 4.8 g by mouth every morning.  . Multiple Vitamin (MULTIVITAMIN) tablet Take 1 tablet by mouth daily.  . Zinc Oxide 10 % OINT Apply topically.     Allergies:   Aleve [naproxen sodium] and Ibuprofen   Social History   Socioeconomic History  . Marital status: Married    Spouse name: Not on file  . Number of children: Not on file  . Years of education: Not on file  . Highest education level: Not on file  Occupational History  .  Occupation: Retired  Tobacco Use  . Smoking status: Former Smoker    Packs/day: 2.50    Years: 46.00    Pack years: 115.00    Types: Cigarettes    Quit date: 05/23/1996    Years since quitting: 23.9  . Smokeless tobacco: Never Used  Vaping Use  . Vaping Use: Never used  Substance and Sexual Activity  . Alcohol use: Yes    Alcohol/week: 3.0 standard drinks    Types: 3 Cans of beer per week  . Drug use: No  . Sexual activity: Not on file  Other Topics Concern  . Not on file  Social History Narrative  . Not on file   Social Determinants of Health   Financial Resource Strain:   . Difficulty of Paying Living Expenses: Not on file  Food Insecurity:   . Worried About Programme researcher, broadcasting/film/video in the Last Year: Not on file  . Ran Out of Food  in the Last Year: Not on file  Transportation Needs:   . Lack of Transportation (Medical): Not on file  . Lack of Transportation (Non-Medical): Not on file  Physical Activity:   . Days of Exercise per Week: Not on file  . Minutes of Exercise per Session: Not on file  Stress:   . Feeling of Stress : Not on file  Social Connections:   . Frequency of Communication with Friends and Family: Not on file  . Frequency of Social Gatherings with Friends and Family: Not on file  . Attends Religious Services: Not on file  . Active Member of Clubs or Organizations: Not on file  . Attends Banker Meetings: Not on file  . Marital Status: Not on file     Family History: The patient's family history includes Alzheimer's disease in his father; Dementia in his mother.  ROS:   ROS Please see the history of present illness.     All other systems reviewed and are negative.  EKGs/Labs/Other Studies Reviewed:    The following studies were reviewed today:   EKG:  EKG is  ordered today.  The ekg ordered today is personally reviewed and demonstrates baseline sinus rhythm normal QRS morphology frequent APCs the predominant rhythm is atrial  bigeminy    Physical Exam:    VS:  BP 132/60   Pulse 94   Ht 5\' 10"  (1.778 m)   Wt 202 lb 3.2 oz (91.7 kg)   SpO2 95%   BMI 29.01 kg/m     Wt Readings from Last 3 Encounters:  04/20/20 202 lb 3.2 oz (91.7 kg)  03/13/20 204 lb (92.5 kg)  01/07/20 205 lb (93 kg)     GEN:  Well nourished, well developed in no acute distress HEENT: Normal NECK: No JVD; No carotid bruits LYMPHATICS: No lymphadenopathy CARDIAC: Distant 2/6 aortic ejection murmur does not encompass S2 does not radiate to the carotids aortic regurgitation RRR,  rubs, gallops RESPIRATORY:  Clear to auscultation without rales, wheezing or rhonchi barrel chested prolonged expiration ABDOMEN: Soft, non-tender, non-distended MUSCULOSKELETAL:  No edema; No deformity  SKIN: Warm and dry NEUROLOGIC:  Alert and oriented x 3 PSYCHIATRIC:  Normal affect     Signed, 01/09/20, MD  04/20/2020 3:11 PM     [facility]

## 2020-04-20 ENCOUNTER — Other Ambulatory Visit: Payer: Self-pay

## 2020-04-20 ENCOUNTER — Ambulatory Visit: Payer: Medicare Other | Admitting: Cardiology

## 2020-04-20 ENCOUNTER — Encounter: Payer: Self-pay | Admitting: Cardiology

## 2020-04-20 VITALS — BP 132/60 | HR 94 | Ht 70.0 in | Wt 202.2 lb

## 2020-04-20 DIAGNOSIS — I2510 Atherosclerotic heart disease of native coronary artery without angina pectoris: Secondary | ICD-10-CM

## 2020-04-20 DIAGNOSIS — I498 Other specified cardiac arrhythmias: Secondary | ICD-10-CM

## 2020-04-20 DIAGNOSIS — I359 Nonrheumatic aortic valve disorder, unspecified: Secondary | ICD-10-CM | POA: Diagnosis not present

## 2020-04-20 DIAGNOSIS — E7800 Pure hypercholesterolemia, unspecified: Secondary | ICD-10-CM | POA: Diagnosis not present

## 2020-04-20 DIAGNOSIS — I491 Atrial premature depolarization: Secondary | ICD-10-CM

## 2020-04-20 NOTE — Patient Instructions (Signed)
Medication Instructions:  Your physician recommends that you continue on your current medications as directed. Please refer to the Current Medication list given to you today.  *If you need a refill on your cardiac medications before your next appointment, please call your pharmacy*   Lab Work: None If you have labs (blood work) drawn today and your tests are completely normal, you will receive your results only by: MyChart Message (if you have MyChart) OR A paper copy in the mail If you have any lab test that is abnormal or we need to change your treatment, we will call you to review the results.   Testing/Procedures: Your physician has requested that you have an echocardiogram. Echocardiography is a painless test that uses sound waves to create images of your heart. It provides your doctor with information about the size and shape of your heart and how well your heart's chambers and valves are working. This procedure takes approximately one hour. There are no restrictions for this procedure.    Follow-Up: At [facility], you and your health needs are our priority.  As part of our continuing mission to provide you with exceptional heart care, we have created designated Provider Care Teams.  These Care Teams include your primary Cardiologist (physician) and Advanced Practice Providers (APPs -  Physician Assistants and Nurse Practitioners) who all work together to provide you with the care you need, when you need it.  We recommend signing up for the patient portal called "MyChart".  Sign up information is provided on this After Visit Summary.  MyChart is used to connect with patients for Virtual Visits (Telemedicine).  Patients are able to view lab/test results, encounter notes, upcoming appointments, etc.  Non-urgent messages can be sent to your provider as well.   To learn more about what you can do with MyChart, go to [email or web address].com.    Your next appointment:   6 week(s)  The  format for your next appointment:   In Person  Provider:   Brian Munley, MD   Other Instructions   

## 2020-04-29 ENCOUNTER — Other Ambulatory Visit: Payer: Self-pay

## 2020-04-29 DIAGNOSIS — I6529 Occlusion and stenosis of unspecified carotid artery: Secondary | ICD-10-CM

## 2020-05-05 DIAGNOSIS — E538 Deficiency of other specified B group vitamins: Secondary | ICD-10-CM | POA: Diagnosis not present

## 2020-05-05 DIAGNOSIS — Z23 Encounter for immunization: Secondary | ICD-10-CM | POA: Diagnosis not present

## 2020-05-08 ENCOUNTER — Ambulatory Visit (HOSPITAL_COMMUNITY)
Admission: RE | Admit: 2020-05-08 | Discharge: 2020-05-08 | Disposition: A | Discharge status: Home / Self Care | Payer: Medicare Other | Source: Ambulatory Visit | Attending: Vascular Surgery | Admitting: Vascular Surgery

## 2020-05-08 ENCOUNTER — Encounter: Payer: Self-pay | Admitting: Vascular Surgery

## 2020-05-08 ENCOUNTER — Other Ambulatory Visit: Payer: Self-pay

## 2020-05-08 ENCOUNTER — Ambulatory Visit: Payer: Medicare Other | Admitting: Vascular Surgery

## 2020-05-08 ENCOUNTER — Ambulatory Visit (INDEPENDENT_AMBULATORY_CARE_PROVIDER_SITE_OTHER)
Admission: RE | Admit: 2020-05-08 | Discharge: 2020-05-08 | Disposition: A | Discharge status: Home / Self Care | Payer: Medicare Other | Source: Ambulatory Visit | Attending: Vascular Surgery | Admitting: Vascular Surgery

## 2020-05-08 ENCOUNTER — Ambulatory Visit (INDEPENDENT_AMBULATORY_CARE_PROVIDER_SITE_OTHER)
Admission: RE | Admit: 2020-05-08 | Discharge: 2020-05-08 | Disposition: A | Discharge status: Home / Self Care | Payer: Medicare Other | Source: Ambulatory Visit | Attending: Physician Assistant | Admitting: Physician Assistant

## 2020-05-08 VITALS — BP 136/73 | HR 77 | Temp 98.1°F | Resp 20 | Ht 70.0 in | Wt 199.8 lb

## 2020-05-08 DIAGNOSIS — I6529 Occlusion and stenosis of unspecified carotid artery: Secondary | ICD-10-CM | POA: Diagnosis not present

## 2020-05-08 DIAGNOSIS — I779 Disorder of arteries and arterioles, unspecified: Secondary | ICD-10-CM

## 2020-05-08 DIAGNOSIS — I739 Peripheral vascular disease, unspecified: Secondary | ICD-10-CM | POA: Diagnosis not present

## 2020-05-08 NOTE — Progress Notes (Signed)
Opened in Error.

## 2020-05-08 NOTE — Progress Notes (Signed)
Patient ID: Derek [name], male   DOB: 1940/10/23, 79 y.o.   MRN: [id number]  Reason for Consult: Follow-up   Referred by Noni Saupe, MD  Subjective:     HPI:  Derek [name] is a 79 y.o. male history of left common and external leg artery stenting in 2019.  He also has a previous left femoropopliteal bypass by Dr. Hart Rochester and a previous right common iliac artery stent.  He states that he does have mostly pain in his back limits him from bending over but he is able to play golf quite frequently.  He has leg pain when he walks from his golf cart to the green other than that no tissue loss or ulceration.  He was found to have right-sided carotid stenosis does not have any previous history of stroke TIA or amaurosis.  He has hypercholesterolemia as well as history of smoking and hypertension.  He takes Plavix and a statin daily.  Past Medical History:  Diagnosis Date  . Androgen deficiency   . Arthritis   . Atherosclerosis of native arteries of the extremities with intermittent claudication 02/04/2014  . B12 deficiency   . Claudication (HCC)   . COPD (chronic obstructive pulmonary disease) (HCC)   . COPD GOLD II     Pfts 07/15/13:  FeV1 1.46 (56%)  59% ratio 68   RV/TLC 145%  dlco  59%> 66%    . Degenerative arthritis   . Hypertension   . IBS (irritable bowel syndrome)   . Obese   . PAD (peripheral artery disease) (HCC)   . Pulmonary nodule   . Pure hypercholesterolemia   . Ulcerative colitis (HCC)    Family History  Problem Relation Age of Onset  . Alzheimer's disease Father   . Dementia Mother    Past Surgical History:  Procedure Laterality Date  . ABDOMINAL AORTOGRAM W/LOWER EXTREMITY N/A 10/30/2017   Procedure: ABDOMINAL AORTOGRAM W/LOWER EXTREMITY;  Surgeon: Maeola Harman, MD;  Location: [facility] INVASIVE CV LAB;  Service: Cardiovascular;  Laterality: N/A;  . AORTA - ILIAC ARTERY BYPASS GRAFT    . PERIPHERAL VASCULAR INTERVENTION  10/30/2017   Procedure:  PERIPHERAL VASCULAR INTERVENTION;  Surgeon: Maeola Harman, MD;  Location: [facility] INVASIVE CV LAB;  Service: Cardiovascular;;  LT Iliac    Short Social History:  Social History   Tobacco Use  . Smoking status: Former Smoker    Packs/day: 2.50    Years: 46.00    Pack years: 115.00    Types: Cigarettes    Quit date: 05/23/1996    Years since quitting: 23.9  . Smokeless tobacco: Never Used  Substance Use Topics  . Alcohol use: Yes    Alcohol/week: 3.0 standard drinks    Types: 3 Cans of beer per week    Allergies  Allergen Reactions  . Aleve [Naproxen Sodium]     Due to blood pressure    Current Outpatient Medications  Medication Sig Dispense Refill  . amLODipine (NORVASC) 5 MG tablet Take 1 tablet by mouth daily.    Marland Kitchen atorvastatin (LIPITOR) 10 MG tablet Take 10 mg by mouth at bedtime.  5  . clopidogrel (PLAVIX) 75 MG tablet Take 75 mg by mouth daily.    . Cyanocobalamin (VITAMIN B-12 IJ) Inject 1,000 mcg as directed every 30 (thirty) days.     Marland Kitchen gabapentin (NEURONTIN) 300 MG capsule Take 300 mg by mouth 3 (three) times daily.    . hydrochlorothiazide (HYDRODIURIL) 12.5 MG tablet Take  1 tablet by mouth daily.    . mesalamine (LIALDA) 1.2 g EC tablet Take 4.8 g by mouth every morning.    . Multiple Vitamin (MULTIVITAMIN) tablet Take 1 tablet by mouth daily.    Monte Fantasia INHUB 100-50 MCG/DOSE AEPB Inhale 1 puff into the lungs 2 (two) times daily.    . Zinc Oxide 10 % OINT Apply topically.    . ramelteon (ROZEREM) 8 MG tablet Take 8 mg by mouth at bedtime. (Patient not taking: No sig reported)     No current facility-administered medications for this visit.    Review of Systems  Constitutional:  Constitutional negative.      Intentional wt. Loss   HENT:       Jaw pain Eyes: Eyes negative.  Respiratory: Respiratory negative.  Cardiovascular: Positive for claudication.  GI: Gastrointestinal negative.  Musculoskeletal: Positive for back pain.  Skin: Skin negative.   Neurological: Neurological negative. Hematologic: Hematologic/lymphatic negative.  Psychiatric: Psychiatric negative.        Objective:  Objective   Vitals:   05/08/20 1045 05/08/20 1049  BP: 121/72 136/73  Pulse: 77   Resp: 20   Temp: 98.1 F (36.7 C)   SpO2: 95%   Weight: 199 lb 12.8 oz (90.6 kg)   Height: 5\' 10"  (1.778 m)    Body mass index is 28.67 kg/m.  Physical Exam HENT:     Head: Normocephalic.     Nose:     Comments: Wearing a mask Eyes:     Pupils: Pupils are equal, round, and reactive to light.  Neck:     Vascular: Carotid bruit present.  Cardiovascular:     Rate and Rhythm: Normal rate.     Pulses:          Femoral pulses are 1+ on the right side and 1+ on the left side. Abdominal:     General: Abdomen is flat.     Palpations: Abdomen is soft. There is no mass.  Musculoskeletal:        General: No swelling. Normal range of motion.  Skin:    General: Skin is warm and dry.     Capillary Refill: Capillary refill takes less than 2 seconds.  Neurological:     General: No focal deficit present.     Mental Status: He is alert.  Psychiatric:        Mood and Affect: Mood normal.        Behavior: Behavior normal.        Thought Content: Thought content normal.        Judgment: Judgment normal.     Data: ABI on the right 0.55 with toe pressure 67 and left 0.93 with toe pressure 32.  Carotid Duplex summary:  Right Carotid: Velocities in the right ICA are consistent with a 40-59%  (high         end of range)stenosis; however, velocities may be  underestimated         due to limited visualization.         The ECA appears >50% stenosed.   Left Carotid: Velocities in the left ICA are consistent with a 1-39%  stenosis.   Vertebrals: Right vertebral artery demonstrates antegrade flow.          Left vertebral artery was not visualized.  Subclavians: Normal flow hemodynamics were seen in bilateral subclavian         arteries.    Aortoiliac duplex Stenosis: +-------------------+---------------+  Location  Stent       +-------------------+---------------+  Right Common Iliac 50-99% stenosis  +-------------------+---------------+  Left External Iliac50-99% stenosis  +-------------------+---------------+   Difficult to delineate the proximal and distal ends of the iliac stenting  due to overlying bowel gas and disease.      Assessment/Plan:     79 year old male with previous history of right common and left common external leg artery stents as well as a left femoropopliteal bypass with vein in the past.  Right side is known to have occluded SFA.  He does have decreased ABIs on that side without life limiting claudication as he is able to walk as far as he needs.  No tissue loss or ulceration.  Femoral pulses are palpable although weak and to suggest that the stenosis in the stents is real as seen on duplex.  Given that he really does not have any symptomatic lesions he will follow-up in 1 year with repeat studies.     Maeola Harman MD [facility]

## 2020-05-18 ENCOUNTER — Other Ambulatory Visit: Payer: Self-pay

## 2020-05-18 ENCOUNTER — Ambulatory Visit (INDEPENDENT_AMBULATORY_CARE_PROVIDER_SITE_OTHER): Payer: Medicare Other

## 2020-05-18 DIAGNOSIS — I359 Nonrheumatic aortic valve disorder, unspecified: Secondary | ICD-10-CM | POA: Diagnosis not present

## 2020-05-18 DIAGNOSIS — I2510 Atherosclerotic heart disease of native coronary artery without angina pectoris: Secondary | ICD-10-CM

## 2020-05-18 DIAGNOSIS — I491 Atrial premature depolarization: Secondary | ICD-10-CM

## 2020-05-18 DIAGNOSIS — E7800 Pure hypercholesterolemia, unspecified: Secondary | ICD-10-CM

## 2020-05-18 DIAGNOSIS — I498 Other specified cardiac arrhythmias: Secondary | ICD-10-CM

## 2020-05-18 LAB — ECHOCARDIOGRAM COMPLETE
AR max vel: 0.93 cm2
AV Area VTI: 0.89 cm2
AV Area mean vel: 0.9 cm2
AV Mean grad: 11 mmHg
AV Peak grad: 21.1 mmHg
Ao pk vel: 2.3 m/s
Area-P 1/2: 4.49 cm2
Calc EF: 33.3 %
S' Lateral: 4.8 cm
Single Plane A2C EF: 34.2 %
Single Plane A4C EF: 30.9 %

## 2020-05-19 ENCOUNTER — Telehealth: Payer: Self-pay

## 2020-05-19 NOTE — Telephone Encounter (Signed)
Left message on patients voicemail to please return our call.   

## 2020-05-20 ENCOUNTER — Telehealth: Payer: Self-pay

## 2020-05-20 NOTE — Telephone Encounter (Signed)
Left message on patients voicemail to please return our call.   

## 2020-05-21 ENCOUNTER — Telehealth: Payer: Self-pay

## 2020-05-21 NOTE — Telephone Encounter (Signed)
Left message on patients voicemail to please return our call.   

## 2020-05-25 ENCOUNTER — Telehealth: Payer: Self-pay

## 2020-05-25 DIAGNOSIS — I10 Essential (primary) hypertension: Secondary | ICD-10-CM

## 2020-05-25 MED ORDER — ENTRESTO 24-26 MG PO TABS: 1.0000 | ORAL_TABLET | Freq: Two times a day (BID) | ORAL | 3 refills | Status: DC

## 2020-05-25 NOTE — Telephone Encounter (Signed)
Spoke with patient regarding results and recommendation.  Patient verbalizes understanding and is agreeable to plan of care. Advised patient to call back with any issues or concerns.  

## 2020-06-01 DIAGNOSIS — I119 Hypertensive heart disease without heart failure: Secondary | ICD-10-CM | POA: Insufficient documentation

## 2020-06-01 DIAGNOSIS — I10 Essential (primary) hypertension: Secondary | ICD-10-CM | POA: Insufficient documentation

## 2020-06-02 DIAGNOSIS — M5431 Sciatica, right side: Secondary | ICD-10-CM | POA: Diagnosis not present

## 2020-06-02 DIAGNOSIS — M545 Low back pain, unspecified: Secondary | ICD-10-CM | POA: Diagnosis not present

## 2020-06-02 DIAGNOSIS — M5432 Sciatica, left side: Secondary | ICD-10-CM | POA: Diagnosis not present

## 2020-06-02 DIAGNOSIS — M4726 Other spondylosis with radiculopathy, lumbar region: Secondary | ICD-10-CM | POA: Diagnosis not present

## 2020-06-04 DIAGNOSIS — I10 Essential (primary) hypertension: Secondary | ICD-10-CM | POA: Diagnosis not present

## 2020-06-05 ENCOUNTER — Telehealth: Payer: Self-pay

## 2020-06-05 DIAGNOSIS — M26643 Arthritis of bilateral temporomandibular joint: Secondary | ICD-10-CM | POA: Diagnosis not present

## 2020-06-05 LAB — BASIC METABOLIC PANEL
BUN/Creatinine Ratio: 17 (ref 10–24)
BUN: 15 mg/dL (ref 8–27)
CO2: 23 mmol/L (ref 20–29)
Calcium: 9.7 mg/dL (ref 8.6–10.2)
Chloride: 102 mmol/L (ref 96–106)
Creatinine, Ser: 0.86 mg/dL (ref 0.76–1.27)
GFR calc Af Amer: 94 mL/min/{1.73_m2} (ref >59)
GFR calc non Af Amer: 81 mL/min/{1.73_m2} (ref >59)
Glucose: 147 mg/dL — ABNORMAL HIGH (ref 65–99)
Potassium: 4.2 mmol/L (ref 3.5–5.2)
Sodium: 140 mmol/L (ref 134–144)

## 2020-06-05 LAB — PRO B NATRIURETIC PEPTIDE: NT-Pro BNP: 1476 pg/mL — ABNORMAL HIGH (ref 0–486)

## 2020-06-05 NOTE — Telephone Encounter (Signed)
-----   Message from Baldo Daub, MD sent at 06/05/2020 12:09 PM EST ----- I have reviewed his chart and I am not quite sure why we drew these labs.  I think that they are good can you ask him if these are being done for specific reason.

## 2020-06-05 NOTE — Telephone Encounter (Signed)
Left message on patients voicemail to please return our call.   

## 2020-06-08 ENCOUNTER — Telehealth: Payer: Self-pay

## 2020-06-08 ENCOUNTER — Ambulatory Visit: Payer: Medicare Other | Admitting: Cardiology

## 2020-06-08 NOTE — Telephone Encounter (Signed)
Left message on patients voicemail to please return our call.   

## 2020-06-08 NOTE — Telephone Encounter (Signed)
-----   Message from Brian J Munley, MD sent at 06/05/2020 12:09 PM EST ----- I have reviewed his chart and I am not quite sure why we drew these labs.  I think that they are good can you ask him if these are being done for specific reason. 

## 2020-06-30 DIAGNOSIS — M4726 Other spondylosis with radiculopathy, lumbar region: Secondary | ICD-10-CM | POA: Diagnosis not present

## 2020-06-30 DIAGNOSIS — M5431 Sciatica, right side: Secondary | ICD-10-CM | POA: Diagnosis not present

## 2020-06-30 DIAGNOSIS — M5432 Sciatica, left side: Secondary | ICD-10-CM | POA: Diagnosis not present

## 2020-06-30 DIAGNOSIS — M545 Low back pain, unspecified: Secondary | ICD-10-CM | POA: Diagnosis not present

## 2020-07-03 ENCOUNTER — Ambulatory Visit: Payer: Medicare Other | Admitting: Cardiology

## 2020-07-03 ENCOUNTER — Other Ambulatory Visit: Payer: Self-pay

## 2020-07-03 ENCOUNTER — Encounter: Payer: Self-pay | Admitting: Cardiology

## 2020-07-03 VITALS — BP 130/62 | HR 91 | Ht 70.0 in | Wt 201.6 lb

## 2020-07-03 DIAGNOSIS — E7800 Pure hypercholesterolemia, unspecified: Secondary | ICD-10-CM

## 2020-07-03 DIAGNOSIS — I491 Atrial premature depolarization: Secondary | ICD-10-CM | POA: Diagnosis not present

## 2020-07-03 DIAGNOSIS — J449 Chronic obstructive pulmonary disease, unspecified: Secondary | ICD-10-CM | POA: Diagnosis not present

## 2020-07-03 DIAGNOSIS — I498 Other specified cardiac arrhythmias: Secondary | ICD-10-CM | POA: Diagnosis not present

## 2020-07-03 DIAGNOSIS — I350 Nonrheumatic aortic (valve) stenosis: Secondary | ICD-10-CM

## 2020-07-03 DIAGNOSIS — I519 Heart disease, unspecified: Secondary | ICD-10-CM

## 2020-07-03 DIAGNOSIS — I119 Hypertensive heart disease without heart failure: Secondary | ICD-10-CM

## 2020-07-03 HISTORY — DX: Heart disease, unspecified: I51.9

## 2020-07-03 MED ORDER — METOPROLOL SUCCINATE ER 25 MG PO TB24: 12.5000 mg | ORAL_TABLET | Freq: Every day | ORAL | 3 refills | Status: DC

## 2020-07-03 MED ORDER — SACUBITRIL-VALSARTAN 49-51 MG PO TABS: 1.0000 | ORAL_TABLET | Freq: Two times a day (BID) | ORAL | 3 refills | Status: DC

## 2020-07-03 NOTE — Progress Notes (Signed)
Cardiology Office Note:    Date:  07/03/2020   ID:  Derek [name], DOB 08/06/1940, MRN [id number]  PCP:  Noni Saupe, MD  Cardiologist:  Norman Herrlich, MD    Referring MD: Noni Saupe, MD    ASSESSMENT:    1. Hypertensive heart disease, unspecified whether heart failure present   2. Pure hypercholesterolemia   3. Chronic obstructive pulmonary disease, unspecified COPD type (HCC)   4. Left ventricular dysfunction   5. APC (atrial premature contractions)   6. Chaotic atrial rhythm   7. Nonrheumatic aortic valve stenosis    PLAN:    In order of problems listed above:  1. Improved I will uptitrate Entresto and start a low-dose of a beta-blocker function proBNP.  I think he should have some ischemia evaluation and we will set him up for a Lexiscan Myoview in our office. 2. Continue his high intensity statin 3. Stable managed by his PCP 4. Recheck in 3 months plan to repeat echocardiogram afterwards 5. Stable atrial arrhythmia 6. Is aortic stenosis mild asymptomatic complete a repeat echocardiogram in 1 year   Next appointment: 6 weeks   Medication Adjustments/Labs and Tests Ordered: Current medicines are reviewed at length with the patient today.  Concerns regarding medicines are outlined above.  Orders Placed This Encounter  Procedures  . Pro b natriuretic peptide (BNP)  . Basic metabolic panel  . MYOCARDIAL PERFUSION IMAGING   Meds ordered this encounter  Medications  . sacubitril-valsartan (ENTRESTO) 49-51 MG    Sig: Take 1 tablet by mouth 2 (two) times daily.    Dispense:  180 tablet    Refill:  3  . metoprolol succinate (TOPROL XL) 25 MG 24 hr tablet    Sig: Take 0.5 tablets (12.5 mg total) by mouth daily.    Dispense:  45 tablet    Refill:  3    Follow-up after echocardiogram with reduced ejection fraction initiating Entresto  History of Present Illness:    Derek [name] is a 80 y.o. male with a hx of hypertension hyperlipidemia ulcerative  colitis peripheral arterial disease with previous lower extremity revascularization last seen 04/20/2020.  Other problems include COPD and chaotic atrial rhythm.He was seen by vascular surgery 01/07/2020 history of left common and external iliac artery stenting 10/30/2017 and a remote left femoral-popliteal bypass with vein graft more than 20 years ago and a right common internal artery stent 2011.  He has stable claudication.  He is a former smoker.  Most recent ABI right side 0.67 left 0.65.  Compliance with diet, lifestyle and medications: Yes  He is under enormous stress at home with his wife's dementia he has hospice either home care or she is in hospice he is trying very hard not to put her in skilled nursing.  He has ongoing back pain and is scheduled for an MRI.  Overall he feels better his strength and endurance are better and he thinks that he been improved with Entresto.  No edema orthopnea shortness of breath chest pain or syncope.  Following that visit he had an echocardiogram performed 05/18/2020 showing ejection fraction of 30 to 35% mild left ventricular hypertrophy normal diastolic filling pressures and mild aortic stenosis with peak and mean gradients of 21 and 11 mmHg.  I reviewed his echocardiogram and estimate the ejection fraction in the range of 40%. Past Medical History:  Diagnosis Date  . Androgen deficiency   . Arthritis   . Atherosclerosis of native arteries of  the extremities with intermittent claudication 02/04/2014  . B12 deficiency   . Claudication (HCC)   . COPD (chronic obstructive pulmonary disease) (HCC)   . COPD GOLD II     Pfts 07/15/13:  FeV1 1.46 (56%)  59% ratio 68   RV/TLC 145%  dlco  59%> 66%    . Degenerative arthritis   . Essential hypertension   . Hypertension   . IBS (irritable bowel syndrome)   . Obese   . PAD (peripheral artery disease) (HCC)   . Pulmonary nodule   . Pure hypercholesterolemia   . Ulcerative colitis [facility])     Past Surgical  History:  Procedure Laterality Date  . ABDOMINAL AORTOGRAM W/LOWER EXTREMITY N/A 10/30/2017   Procedure: ABDOMINAL AORTOGRAM W/LOWER EXTREMITY;  Surgeon: Maeola Harman, MD;  Location: [facility] INVASIVE CV LAB;  Service: Cardiovascular;  Laterality: N/A;  . AORTA - ILIAC ARTERY BYPASS GRAFT    . PERIPHERAL VASCULAR INTERVENTION  10/30/2017   Procedure: PERIPHERAL VASCULAR INTERVENTION;  Surgeon: Maeola Harman, MD;  Location: [facility] INVASIVE CV LAB;  Service: Cardiovascular;;  LT Iliac    Current Medications: Current Meds  Medication Sig  . atorvastatin (LIPITOR) 10 MG tablet Take 10 mg by mouth at bedtime.  . clopidogrel (PLAVIX) 75 MG tablet Take 75 mg by mouth daily.  . metoprolol succinate (TOPROL XL) 25 MG 24 hr tablet Take 0.5 tablets (12.5 mg total) by mouth daily.  . sacubitril-valsartan (ENTRESTO) 49-51 MG Take 1 tablet by mouth 2 (two) times daily.  Monte Fantasia INHUB 100-50 MCG/DOSE AEPB Inhale 1 puff into the lungs 2 (two) times daily.     Allergies:   Aleve [naproxen sodium]   Social History   Socioeconomic History  . Marital status: Married    Spouse name: Not on file  . Number of children: Not on file  . Years of education: Not on file  . Highest education level: Not on file  Occupational History  . Occupation: Retired  Tobacco Use  . Smoking status: Former Smoker    Packs/day: 2.50    Years: 46.00    Pack years: 115.00    Types: Cigarettes    Quit date: 05/23/1996    Years since quitting: 24.1  . Smokeless tobacco: Never Used  Vaping Use  . Vaping Use: Never used  Substance and Sexual Activity  . Alcohol use: Yes    Alcohol/week: 3.0 standard drinks    Types: 3 Cans of beer per week  . Drug use: No  . Sexual activity: Not on file  Other Topics Concern  . Not on file  Social History Narrative  . Not on file   Social Determinants of Health   Financial Resource Strain: Not on file  Food Insecurity: Not on file  Transportation Needs: Not on  file  Physical Activity: Not on file  Stress: Not on file  Social Connections: Not on file     Family History: The patient's family history includes Alzheimer's disease in his father; Dementia in his mother. ROS:   Please see the history of present illness.    All other systems reviewed and are negative.  EKGs/Labs/Other Studies Reviewed:    The following studies were reviewed today:  EKG: KJ 04/20/2020 showed sinus rhythm with frequent APCs  Recent Labs: 06/04/2020: BUN 15; Creatinine, Ser 0.86; NT-Pro BNP 1,476; Potassium 4.2; Sodium 140  Recent Lipid Panel No results found for: CHOL, TRIG, HDL, CHOLHDL, VLDL, LDLCALC, LDLDIRECT  Physical Exam:    VS:  BP 130/62 (BP Location: Right Arm, Patient Position: Sitting, Cuff Size: Normal)   Pulse 91   Ht 5\' 10"  (1.778 m)   Wt 201 lb 9.6 oz (91.4 kg)   SpO2 96%   BMI 28.93 kg/m     Wt Readings from Last 3 Encounters:  07/03/20 201 lb 9.6 oz (91.4 kg)  05/08/20 199 lb 12.8 oz (90.6 kg)  04/20/20 202 lb 3.2 oz (91.7 kg)     GEN:  Well nourished, well developed in no acute distress HEENT: Normal NECK: No JVD; No carotid bruits LYMPHATICS: No lymphadenopathy CARDIAC: RRR, no murmurs, rubs, gallops RESPIRATORY:  Clear to auscultation without rales, wheezing or rhonchi  ABDOMEN: Soft, non-tender, non-distended MUSCULOSKELETAL:  No edema; No deformity  SKIN: Warm and dry NEUROLOGIC:  Alert and oriented x 3 PSYCHIATRIC:  Normal affect    Signed, 04/22/20, MD  07/03/2020 4:04 PM    [facility]

## 2020-07-03 NOTE — Patient Instructions (Addendum)
Medication Instructions:  Your physician has recommended you make the following change in your medication:  INCREASE: Entresto 49/51 mg take one tablet by mouth twice daily.  START: TOPROL XL 12.5 mg take one tablet by mouth daily.  *If you need a refill on your cardiac medications before your next appointment, please call your pharmacy*   Lab Work: Your physician recommends that you return for lab work in: TODAY BMP, ProBNP If you have labs (blood work) drawn today and your tests are completely normal, you will receive your results only by: Marland Kitchen MyChart Message (if you have MyChart) OR . A paper copy in the mail If you have any lab test that is abnormal or we need to change your treatment, we will call you to review the results.   Testing/Procedures:   [facility] [address] [place], [place] [address] Phone:  [phone]    Please arrive 15 minutes prior to your appointment time for registration and insurance purposes.  The test will take approximately 3 to 4 hours to complete; you may bring reading material.  If someone comes with you to your appointment, they will need to remain in the main lobby due to limited space in the testing area. **If you are pregnant or breastfeeding, please notify the nuclear lab prior to your appointment**  How to prepare for your Myocardial Perfusion Test: . Do not eat or drink 3 hours prior to your test, except you may have water. . Do not consume products containing caffeine (regular or decaffeinated) 12 hours prior to your test. (ex: coffee, chocolate, sodas, tea). . Do bring a list of your current medications with you.  If not listed below, you may take your medications as normal. . Do wear comfortable clothes (no dresses or overalls) and walking shoes, tennis shoes preferred (No heels or open toe shoes are allowed). . Do NOT wear cologne, perfume, aftershave, or lotions (deodorant is allowed). . If these instructions  are not followed, your test will have to be rescheduled.  Please report to [address] for your test.  If you have questions or concerns about your appointment, you can call the [facility] Lab at [phone].  If you cannot keep your appointment, please provide 24 hours notification to the Nuclear Lab, to avoid a possible $50 charge to your account.    Follow-Up: At [facility], you and your health needs are our priority.  As part of our continuing mission to provide you with exceptional heart care, we have created designated Provider Care Teams.  These Care Teams include your primary Cardiologist (physician) and Advanced Practice Providers (APPs -  Physician Assistants and Nurse Practitioners) who all work together to provide you with the care you need, when you need it.  We recommend signing up for the patient portal called "MyChart".  Sign up information is provided on this After Visit Summary.  MyChart is used to connect with patients for Virtual Visits (Telemedicine).  Patients are able to view lab/test results, encounter notes, upcoming appointments, etc.  Non-urgent messages can be sent to your provider as well.   To learn more about what you can do with MyChart, go to [email or web address].    Your next appointment:   3 month(s)  The format for your next appointment:   In Person  Provider:   Norman Herrlich, MD   Other Instructions

## 2020-07-04 LAB — BASIC METABOLIC PANEL
BUN/Creatinine Ratio: 13 (ref 10–24)
BUN: 11 mg/dL (ref 8–27)
CO2: 22 mmol/L (ref 20–29)
Calcium: 8.9 mg/dL (ref 8.6–10.2)
Chloride: 102 mmol/L (ref 96–106)
Creatinine, Ser: 0.86 mg/dL (ref 0.76–1.27)
GFR calc Af Amer: 94 mL/min/{1.73_m2} (ref >59)
GFR calc non Af Amer: 81 mL/min/{1.73_m2} (ref >59)
Glucose: 105 mg/dL — ABNORMAL HIGH (ref 65–99)
Potassium: 3.9 mmol/L (ref 3.5–5.2)
Sodium: 139 mmol/L (ref 134–144)

## 2020-07-04 LAB — PRO B NATRIURETIC PEPTIDE: NT-Pro BNP: 2226 pg/mL — ABNORMAL HIGH (ref 0–486)

## 2020-07-06 ENCOUNTER — Telehealth: Payer: Self-pay

## 2020-07-06 NOTE — Telephone Encounter (Signed)
Left message on patients voicemail to please return our call.   

## 2020-07-07 ENCOUNTER — Telehealth: Payer: Self-pay

## 2020-07-07 DIAGNOSIS — M4726 Other spondylosis with radiculopathy, lumbar region: Secondary | ICD-10-CM | POA: Diagnosis not present

## 2020-07-07 DIAGNOSIS — M545 Low back pain, unspecified: Secondary | ICD-10-CM | POA: Diagnosis not present

## 2020-07-07 NOTE — Telephone Encounter (Signed)
Spoke with patient regarding results and recommendation.  Patient verbalizes understanding and is agreeable to plan of care. Advised patient to call back with any issues or concerns.  

## 2020-07-07 NOTE — Telephone Encounter (Signed)
Left message on patients voicemail to please return our call.   

## 2020-07-09 ENCOUNTER — Telehealth (HOSPITAL_COMMUNITY): Payer: Self-pay

## 2020-07-09 ENCOUNTER — Telehealth: Payer: Self-pay

## 2020-07-09 NOTE — Telephone Encounter (Signed)
Note sent to Dr. Dulce Sellar to please sign attestation for stress test.

## 2020-07-09 NOTE — Addendum Note (Signed)
Addended by: Nomar Broad on: 07/09/2020 12:03 PM   Modules accepted: Orders  

## 2020-07-10 NOTE — Telephone Encounter (Signed)
Discussed with Dr. Dulce Sellar and he states that he does not want the patient rescheduled at this time.

## 2020-07-10 NOTE — Telephone Encounter (Signed)
Yes reschedule 2 weeks

## 2020-07-13 ENCOUNTER — Telehealth: Payer: Self-pay | Admitting: Cardiology

## 2020-07-13 DIAGNOSIS — R001 Bradycardia, unspecified: Secondary | ICD-10-CM | POA: Diagnosis not present

## 2020-07-13 DIAGNOSIS — J329 Chronic sinusitis, unspecified: Secondary | ICD-10-CM | POA: Diagnosis not present

## 2020-07-13 DIAGNOSIS — J40 Bronchitis, not specified as acute or chronic: Secondary | ICD-10-CM | POA: Diagnosis not present

## 2020-07-13 NOTE — Telephone Encounter (Signed)
Patient's HR is 47.  Feels like metoprolol (new med) may be the issue. Please advise.  [phone]  Or   [phone]   Thank you!

## 2020-07-13 NOTE — Telephone Encounter (Signed)
Spoke with the patient, he stated that he has been sick for the last 6 days and is going to his primary care doctor today. He will call back if he has to cancel. S.Ashten Sarnowski EMTP

## 2020-07-13 NOTE — Telephone Encounter (Signed)
Spoke to the patient just now and he let me know that he did not have any questions at this time.    Encouraged patient to call back with any questions or concerns.

## 2020-07-15 ENCOUNTER — Other Ambulatory Visit: Payer: Self-pay

## 2020-07-15 ENCOUNTER — Ambulatory Visit (INDEPENDENT_AMBULATORY_CARE_PROVIDER_SITE_OTHER): Payer: Medicare Other

## 2020-07-15 DIAGNOSIS — I519 Heart disease, unspecified: Secondary | ICD-10-CM

## 2020-07-15 DIAGNOSIS — I491 Atrial premature depolarization: Secondary | ICD-10-CM

## 2020-07-15 DIAGNOSIS — I498 Other specified cardiac arrhythmias: Secondary | ICD-10-CM

## 2020-07-15 DIAGNOSIS — E7800 Pure hypercholesterolemia, unspecified: Secondary | ICD-10-CM | POA: Diagnosis not present

## 2020-07-15 DIAGNOSIS — J449 Chronic obstructive pulmonary disease, unspecified: Secondary | ICD-10-CM

## 2020-07-15 DIAGNOSIS — I119 Hypertensive heart disease without heart failure: Secondary | ICD-10-CM

## 2020-07-15 MED ORDER — REGADENOSON 0.4 MG/5ML IV SOLN
0.4000 mg | Freq: Once | INTRAVENOUS | Status: AC
  Administered 2020-07-15: 0.4 mg via INTRAVENOUS

## 2020-07-15 MED ORDER — TECHNETIUM TC 99M TETROFOSMIN IV KIT
31.2000 | PACK | Freq: Once | INTRAVENOUS | Status: AC | PRN
  Administered 2020-07-15: 31.2 via INTRAVENOUS

## 2020-07-15 MED ORDER — TECHNETIUM TC 99M TETROFOSMIN IV KIT
10.4000 | PACK | Freq: Once | INTRAVENOUS | Status: AC | PRN
  Administered 2020-07-15: 10.4 via INTRAVENOUS

## 2020-07-16 ENCOUNTER — Telehealth: Payer: Self-pay

## 2020-07-16 DIAGNOSIS — M5431 Sciatica, right side: Secondary | ICD-10-CM | POA: Diagnosis not present

## 2020-07-16 DIAGNOSIS — M4726 Other spondylosis with radiculopathy, lumbar region: Secondary | ICD-10-CM | POA: Diagnosis not present

## 2020-07-16 DIAGNOSIS — M5432 Sciatica, left side: Secondary | ICD-10-CM | POA: Diagnosis not present

## 2020-07-16 LAB — MYOCARDIAL PERFUSION IMAGING
Peak HR: 104 {beats}/min
Rest HR: 88 {beats}/min
SDS: 1
SRS: 3
SSS: 4
TID: 1.06

## 2020-07-16 NOTE — Telephone Encounter (Signed)
Spoke with patient regarding results and recommendation.  Patient verbalizes understanding and is agreeable to plan of care. Advised patient to call back with any issues or concerns.  

## 2020-07-16 NOTE — Telephone Encounter (Signed)
-----   Message from Baldo Daub, MD sent at 07/16/2020  8:49 AM EST ----- I think that for him this is a good result no findings of severe blockage of his heart arteries.  No change in his medications.

## 2020-07-23 DIAGNOSIS — E538 Deficiency of other specified B group vitamins: Secondary | ICD-10-CM | POA: Diagnosis not present

## 2020-07-24 DIAGNOSIS — K5190 Ulcerative colitis, unspecified, without complications: Secondary | ICD-10-CM | POA: Diagnosis not present

## 2020-07-24 DIAGNOSIS — M609 Myositis, unspecified: Secondary | ICD-10-CM | POA: Diagnosis not present

## 2020-07-24 DIAGNOSIS — M4726 Other spondylosis with radiculopathy, lumbar region: Secondary | ICD-10-CM | POA: Diagnosis not present

## 2020-07-27 DIAGNOSIS — D649 Anemia, unspecified: Secondary | ICD-10-CM | POA: Diagnosis not present

## 2020-07-27 DIAGNOSIS — I498 Other specified cardiac arrhythmias: Secondary | ICD-10-CM | POA: Diagnosis not present

## 2020-07-27 DIAGNOSIS — M629 Disorder of muscle, unspecified: Secondary | ICD-10-CM | POA: Diagnosis not present

## 2020-07-30 DIAGNOSIS — M629 Disorder of muscle, unspecified: Secondary | ICD-10-CM | POA: Diagnosis not present

## 2020-07-31 DIAGNOSIS — D510 Vitamin B12 deficiency anemia due to intrinsic factor deficiency: Secondary | ICD-10-CM | POA: Diagnosis not present

## 2020-07-31 DIAGNOSIS — Z79899 Other long term (current) drug therapy: Secondary | ICD-10-CM | POA: Diagnosis not present

## 2020-07-31 DIAGNOSIS — D72829 Elevated white blood cell count, unspecified: Secondary | ICD-10-CM | POA: Diagnosis not present

## 2020-07-31 DIAGNOSIS — D649 Anemia, unspecified: Secondary | ICD-10-CM | POA: Diagnosis not present

## 2020-08-05 DIAGNOSIS — Z1211 Encounter for screening for malignant neoplasm of colon: Secondary | ICD-10-CM | POA: Diagnosis not present

## 2020-08-08 DIAGNOSIS — Z87891 Personal history of nicotine dependence: Secondary | ICD-10-CM | POA: Diagnosis not present

## 2020-08-08 DIAGNOSIS — E7800 Pure hypercholesterolemia, unspecified: Secondary | ICD-10-CM | POA: Diagnosis not present

## 2020-08-08 DIAGNOSIS — Z79899 Other long term (current) drug therapy: Secondary | ICD-10-CM | POA: Diagnosis not present

## 2020-08-08 DIAGNOSIS — R000 Tachycardia, unspecified: Secondary | ICD-10-CM | POA: Diagnosis not present

## 2020-08-08 DIAGNOSIS — R0602 Shortness of breath: Secondary | ICD-10-CM | POA: Diagnosis not present

## 2020-08-08 DIAGNOSIS — I491 Atrial premature depolarization: Secondary | ICD-10-CM | POA: Diagnosis not present

## 2020-08-08 DIAGNOSIS — Z7901 Long term (current) use of anticoagulants: Secondary | ICD-10-CM | POA: Diagnosis not present

## 2020-08-08 DIAGNOSIS — J449 Chronic obstructive pulmonary disease, unspecified: Secondary | ICD-10-CM | POA: Diagnosis not present

## 2020-08-08 DIAGNOSIS — J9601 Acute respiratory failure with hypoxia: Secondary | ICD-10-CM | POA: Diagnosis not present

## 2020-08-08 DIAGNOSIS — I110 Hypertensive heart disease with heart failure: Secondary | ICD-10-CM | POA: Diagnosis not present

## 2020-08-08 DIAGNOSIS — I517 Cardiomegaly: Secondary | ICD-10-CM | POA: Diagnosis not present

## 2020-08-08 DIAGNOSIS — J440 Chronic obstructive pulmonary disease with acute lower respiratory infection: Secondary | ICD-10-CM | POA: Diagnosis not present

## 2020-08-08 DIAGNOSIS — J9811 Atelectasis: Secondary | ICD-10-CM | POA: Diagnosis not present

## 2020-08-08 DIAGNOSIS — I5043 Acute on chronic combined systolic (congestive) and diastolic (congestive) heart failure: Secondary | ICD-10-CM | POA: Diagnosis not present

## 2020-08-08 DIAGNOSIS — Z7952 Long term (current) use of systemic steroids: Secondary | ICD-10-CM | POA: Diagnosis not present

## 2020-08-08 DIAGNOSIS — J90 Pleural effusion, not elsewhere classified: Secondary | ICD-10-CM | POA: Diagnosis not present

## 2020-08-08 DIAGNOSIS — J80 Acute respiratory distress syndrome: Secondary | ICD-10-CM | POA: Diagnosis not present

## 2020-08-08 DIAGNOSIS — Z888 Allergy status to other drugs, medicaments and biological substances status: Secondary | ICD-10-CM | POA: Diagnosis not present

## 2020-08-08 DIAGNOSIS — I509 Heart failure, unspecified: Secondary | ICD-10-CM | POA: Diagnosis not present

## 2020-08-08 DIAGNOSIS — R0902 Hypoxemia: Secondary | ICD-10-CM | POA: Diagnosis not present

## 2020-08-17 DIAGNOSIS — I10 Essential (primary) hypertension: Secondary | ICD-10-CM | POA: Diagnosis not present

## 2020-08-17 DIAGNOSIS — E559 Vitamin D deficiency, unspecified: Secondary | ICD-10-CM | POA: Diagnosis not present

## 2020-08-17 DIAGNOSIS — J449 Chronic obstructive pulmonary disease, unspecified: Secondary | ICD-10-CM | POA: Diagnosis not present

## 2020-08-17 DIAGNOSIS — Z9181 History of falling: Secondary | ICD-10-CM | POA: Diagnosis not present

## 2020-08-17 DIAGNOSIS — D649 Anemia, unspecified: Secondary | ICD-10-CM | POA: Diagnosis not present

## 2020-08-17 DIAGNOSIS — Z79899 Other long term (current) drug therapy: Secondary | ICD-10-CM | POA: Diagnosis not present

## 2020-08-17 DIAGNOSIS — I499 Cardiac arrhythmia, unspecified: Secondary | ICD-10-CM | POA: Diagnosis not present

## 2020-08-17 DIAGNOSIS — R0600 Dyspnea, unspecified: Secondary | ICD-10-CM | POA: Diagnosis not present

## 2020-08-18 DIAGNOSIS — K5130 Ulcerative (chronic) rectosigmoiditis without complications: Secondary | ICD-10-CM | POA: Diagnosis not present

## 2020-08-18 DIAGNOSIS — D649 Anemia, unspecified: Secondary | ICD-10-CM | POA: Diagnosis not present

## 2020-08-19 ENCOUNTER — Telehealth: Payer: Self-pay

## 2020-08-19 NOTE — Telephone Encounter (Signed)
Please add to the schedule next Tuesday or Wednesday with me just double book

## 2020-08-19 NOTE — Telephone Encounter (Signed)
Left message on patients voicemail to please return our call.   

## 2020-08-19 NOTE — Telephone Encounter (Signed)
Spoke to [place] over at [facility] she is working with  Maurie Boettcher, NP. She states that this patient was recently hospitalized for COPD/Pneumonia but Clovis Riley, NP believes that he is having symptoms of heart failure as well. She increased his Furosemide to 20 BID for the next 4 days and increased his potassium as well. She is wondering if Dr. Dulce Sellar would like to see the patient sooner and if we could work him in.   I will route to Dr. Dulce Sellar at this time.

## 2020-08-20 NOTE — Telephone Encounter (Signed)
Left message on patients voicemail to please return our call.   

## 2020-08-21 NOTE — Telephone Encounter (Signed)
Left message on patients voicemail to please return our call.   I will mail the patient a letter at this time as I have tried reaching him x3 with no success.

## 2020-08-24 DIAGNOSIS — R0600 Dyspnea, unspecified: Secondary | ICD-10-CM | POA: Diagnosis not present

## 2020-08-24 DIAGNOSIS — R197 Diarrhea, unspecified: Secondary | ICD-10-CM | POA: Diagnosis not present

## 2020-08-24 DIAGNOSIS — R5381 Other malaise: Secondary | ICD-10-CM | POA: Diagnosis not present

## 2020-08-24 DIAGNOSIS — R001 Bradycardia, unspecified: Secondary | ICD-10-CM | POA: Diagnosis not present

## 2020-08-24 DIAGNOSIS — R5383 Other fatigue: Secondary | ICD-10-CM | POA: Diagnosis not present

## 2020-08-25 ENCOUNTER — Ambulatory Visit: Payer: Medicare Other | Admitting: Cardiology

## 2020-08-25 ENCOUNTER — Encounter: Payer: Self-pay | Admitting: Cardiology

## 2020-08-25 ENCOUNTER — Other Ambulatory Visit: Payer: Self-pay

## 2020-08-25 VITALS — BP 128/62 | HR 62 | Ht 71.0 in | Wt 189.8 lb

## 2020-08-25 DIAGNOSIS — I060 Rheumatic aortic stenosis: Secondary | ICD-10-CM | POA: Diagnosis not present

## 2020-08-25 DIAGNOSIS — I739 Peripheral vascular disease, unspecified: Secondary | ICD-10-CM | POA: Diagnosis not present

## 2020-08-25 DIAGNOSIS — I10 Essential (primary) hypertension: Secondary | ICD-10-CM | POA: Insufficient documentation

## 2020-08-25 DIAGNOSIS — E559 Vitamin D deficiency, unspecified: Secondary | ICD-10-CM | POA: Diagnosis not present

## 2020-08-25 DIAGNOSIS — I350 Nonrheumatic aortic (valve) stenosis: Secondary | ICD-10-CM

## 2020-08-25 DIAGNOSIS — R0602 Shortness of breath: Secondary | ICD-10-CM

## 2020-08-25 DIAGNOSIS — R0989 Other specified symptoms and signs involving the circulatory and respiratory systems: Secondary | ICD-10-CM

## 2020-08-25 DIAGNOSIS — R931 Abnormal findings on diagnostic imaging of heart and coronary circulation: Secondary | ICD-10-CM

## 2020-08-25 HISTORY — DX: Abnormal findings on diagnostic imaging of heart and coronary circulation: R93.1

## 2020-08-25 HISTORY — DX: Shortness of breath: R06.02

## 2020-08-25 HISTORY — DX: Nonrheumatic aortic (valve) stenosis: I35.0

## 2020-08-25 HISTORY — DX: Other specified symptoms and signs involving the circulatory and respiratory systems: R09.89

## 2020-08-25 NOTE — Progress Notes (Addendum)
Cardiology Office Note:    Date:  08/25/2020   ID:  Derek [name], DOB 03/22/1941, MRN [id number]  PCP:  Ninfa Meeker, FNP  Cardiologist:  No primary care provider on file.  Electrophysiologist:  None   Referring MD: Noni Saupe, MD   "I am very fatigued and short of breath and I feel like I am dying"  History of Present Illness:    Derek [name] is a 80 y.o. male with a hx of hypertension, hyperlipidemia, ulcerative colitis, peripheral artery disease with previous lower extremity revascularization is here today for follow-up visit.  The patient last saw Dr. Dulce Sellar back in February 2022 at that time he had symptoms and was set up for my review.  In the interim the patient was able to get this done which did not show any evidence of ischemia. He is here today for follow-up visit.  Patient tells me he is experiencing significant fatigue, he short of breath, he feels as if every  day he wakes up he is going to die.  Past Medical History:  Diagnosis Date  . Androgen deficiency   . Arthritis   . Atherosclerosis of native arteries of the extremities with intermittent claudication 02/04/2014  . B12 deficiency   . Claudication (HCC)   . COPD (chronic obstructive pulmonary disease) (HCC)   . COPD GOLD II     Pfts 07/15/13:  FeV1 1.46 (56%)  59% ratio 68   RV/TLC 145%  dlco  59%> 66%    . Degenerative arthritis   . Essential hypertension   . Hypertension   . IBS (irritable bowel syndrome)   . Obese   . PAD (peripheral artery disease) (HCC)   . Pulmonary nodule   . Pure hypercholesterolemia   . Ulcerative colitis [facility])     Past Surgical History:  Procedure Laterality Date  . ABDOMINAL AORTOGRAM W/LOWER EXTREMITY N/A 10/30/2017   Procedure: ABDOMINAL AORTOGRAM W/LOWER EXTREMITY;  Surgeon: Maeola Harman, MD;  Location: [facility] INVASIVE CV LAB;  Service: Cardiovascular;  Laterality: N/A;  . AORTA - ILIAC ARTERY BYPASS GRAFT    . PERIPHERAL VASCULAR INTERVENTION   10/30/2017   Procedure: PERIPHERAL VASCULAR INTERVENTION;  Surgeon: Maeola Harman, MD;  Location: [facility] INVASIVE CV LAB;  Service: Cardiovascular;;  LT Iliac    Current Medications: Current Meds  Medication Sig  . amLODipine (NORVASC) 5 MG tablet Take 1 tablet by mouth daily.  Marland Kitchen atorvastatin (LIPITOR) 10 MG tablet Take 10 mg by mouth at bedtime.  . clopidogrel (PLAVIX) 75 MG tablet Take 75 mg by mouth daily.  . Cyanocobalamin (VITAMIN B-12 IJ) Inject 1,000 mcg as directed every 30 (thirty) days.  . hydrochlorothiazide (HYDRODIURIL) 12.5 MG tablet Take 1 tablet by mouth daily.  . mesalamine (LIALDA) 1.2 g EC tablet Take 4.8 g by mouth every morning.  . metoprolol succinate (TOPROL XL) 25 MG 24 hr tablet Take 0.5 tablets (12.5 mg total) by mouth daily.  . Multiple Vitamin (MULTIVITAMIN) tablet Take 1 tablet by mouth daily.  . ramelteon (ROZEREM) 8 MG tablet Take 8 mg by mouth at bedtime.  . sacubitril-valsartan (ENTRESTO) 49-51 MG Take 1 tablet by mouth 2 (two) times daily.  Monte Fantasia INHUB 100-50 MCG/DOSE AEPB Inhale 1 puff into the lungs 2 (two) times daily.  . Zinc Oxide 10 % OINT Apply 1 application topically as needed (burning).     Allergies:   Aleve [naproxen sodium]   Social History   Socioeconomic History  .  Marital status: Married    Spouse name: Not on file  . Number of children: Not on file  . Years of education: Not on file  . Highest education level: Not on file  Occupational History  . Occupation: Retired  Tobacco Use  . Smoking status: Former Smoker    Packs/day: 2.50    Years: 46.00    Pack years: 115.00    Types: Cigarettes    Quit date: 05/23/1996    Years since quitting: 24.2  . Smokeless tobacco: Never Used  Vaping Use  . Vaping Use: Never used  Substance and Sexual Activity  . Alcohol use: Yes    Alcohol/week: 3.0 standard drinks    Types: 3 Cans of beer per week  . Drug use: No  . Sexual activity: Not on file  Other Topics Concern  . Not on  file  Social History Narrative  . Not on file   Social Determinants of Health   Financial Resource Strain: Not on file  Food Insecurity: Not on file  Transportation Needs: Not on file  Physical Activity: Not on file  Stress: Not on file  Social Connections: Not on file     Family History: The patient's family history includes Alzheimer's disease in his father; Dementia in his mother.  ROS:   Review of Systems  Constitution: Negative for decreased appetite, fever and weight gain.  HENT: Negative for congestion, ear discharge, hoarse voice and sore throat.   Eyes: Negative for discharge, redness, vision loss in right eye and visual halos.  Cardiovascular: Reports chest pain, dyspnea on exertion and fatigue.  Negative for leg swelling, orthopnea and palpitations.  Respiratory: Negative for cough, hemoptysis, shortness of breath and snoring.   Endocrine: Negative for heat intolerance and polyphagia.  Hematologic/Lymphatic: Negative for bleeding problem. Does not bruise/bleed easily.  Skin: Negative for flushing, nail changes, rash and suspicious lesions.  Musculoskeletal: Negative for arthritis, joint pain, muscle cramps, myalgias, neck pain and stiffness.  Gastrointestinal: Negative for abdominal pain, bowel incontinence, diarrhea and excessive appetite.  Genitourinary: Negative for decreased libido, genital sores and incomplete emptying.  Neurological: Negative for brief paralysis, focal weakness, headaches and loss of balance.  Psychiatric/Behavioral: Negative for altered mental status, depression and suicidal ideas.  Allergic/Immunologic: Negative for HIV exposure and persistent infections.    EKGs/Labs/Other Studies Reviewed:    The following studies were reviewed today:   EKG: None today    Recent Labs: 07/03/2020: BUN 11; Creatinine, Ser 0.86; NT-Pro BNP 2,226; Potassium 3.9; Sodium 139  Recent Lipid Panel No results found for: CHOL, TRIG, HDL, CHOLHDL, VLDL, LDLCALC,  LDLDIRECT  Physical Exam:    VS:  BP 128/62   Pulse 62   Ht 5' 11" (1.803 m)   Wt 189 lb 12.8 oz (86.1 kg)   SpO2 97%   BMI 26.47 kg/m     Wt Readings from Last 3 Encounters:  08/25/20 189 lb 12.8 oz (86.1 kg)  07/15/20 201 lb (91.2 kg)  07/03/20 201 lb 9.6 oz (91.4 kg)     GEN: Well nourished, well developed in no acute distress HEENT: Normal NECK: No JVD; No carotid bruits LYMPHATICS: No lymphadenopathy CARDIAC: S1S2 noted,RRR, late systolic decrescendo murmurs, rubs, gallops RESPIRATORY:  Clear to auscultation without rales, wheezing or rhonchi  ABDOMEN: Soft, non-tender, non-distended, +bowel sounds, no guarding. EXTREMITIES: No edema, No cyanosis, no clubbing MUSCULOSKELETAL:  No deformity  SKIN: Warm and dry NEUROLOGIC:  Alert and oriented x 3, non-focal PSYCHIATRIC:  Normal affect, good   insight  ASSESSMENT:    1. Severe low-flow low gradient aortic stenosis   2. SOB (shortness of breath)   3. Essential hypertension   4. PAD (peripheral artery disease) (HCC)   5. Depressed left ventricular ejection fraction   6. Hypertension, unspecified type   7. Vitamin D deficiency    PLAN:    His shortness of breath and fatigue.  Could be multifactorial.  I was able to review his echocardiogram which was done in December 2021 independently, his aortic valve is severely calcified with valve area by VTI 0.89, mean gradient 11, peak velocity 2.29 m/s, dimensionless index 0.28 and stroke-volume index 21, index aortic valve area 0.43 all suggesting low-flow low gradient aortic stenosis.  What I like to do at this time given his symptomatology send patient for a left heart catheterization which showed not only assess his coronary arteries but also have the gradients across his valve was assessed as well.  I was able to speak to the patient about this testing he is agreeable to proceed with this.  The patient understands that risks include but are not limited to stroke (1 in 1000),  death (1 in 1000), kidney failure [usually temporary] (1 in 500), bleeding (1 in 200), allergic reaction [possibly serious] (1 in 200), and agrees to proceed.  He will remain on his Plavix 75 mg daily for now.  His blood pressure is acceptable continue patient on amlodipine 5 mg, hydrochlorothiazide 12.5 mg, Toprol-XL 12.5 and Entresto 49-51 mg.   In terms of his cardiomyopathy his recent EF was 30 to 35% he is on Entresto as well as Toprol-XL.    Recent labs from his PCP office show a vitamin D level of 17 he has since been repleted by his PCP.  The patient is in agreement with the above plan. The patient left the office in stable condition.  The patient will follow up in 2 weeks with Dr. Dulce Sellar   Medication Adjustments/Labs and Tests Ordered: Current medicines are reviewed at length with the patient today.  Concerns regarding medicines are outlined above.  Orders Placed This Encounter  Procedures  . Basic metabolic panel  . Magnesium  . CBC with Differential/Platelet   No orders of the defined types were placed in this encounter.   Patient Instructions  Medication Instructions:  Your physician recommends that you continue on your current medications as directed. Please refer to the Current Medication list given to you today.  *If you need a refill on your cardiac medications before your next appointment, please call your pharmacy*   Lab Work: Your physician recommends that you return for lab work:  BMET, Mag, CBC If you have labs (blood work) drawn today and your tests are completely normal, you will receive your results only by: Marland Kitchen MyChart Message (if you have MyChart) OR . A paper copy in the mail If you have any lab test that is abnormal or we need to change your treatment, we will call you to review the results.   Testing/Procedures:    [facility] CARDIOVASCULAR DIVISION [facility] AT [place] [address] [place]  [address] Dept: [phone] Loc: [phone]  MCIHAEL HINDERMAN  08/25/2020  You are scheduled for a Cardiac Catheterization on Friday, April 15 with Dr. Tonny Bollman.  1. Please arrive at the [facility] (Main Entrance A) at [facility]: [address] [place], [place] [address] at 9:00 AM (This time is two hours before your procedure  to ensure your preparation). Free valet parking service is available.   Special note: Every effort is made to have your procedure done on time. Please understand that emergencies sometimes delay scheduled procedures.  2. Diet: Do not eat solid foods after midnight.  The patient may have clear liquids until 5am upon the day of the procedure.  3. Labs: You will need to have blood drawn on Monday, April 11 at [facility]: [address] [place] . You do not need to be fasting.  4. Medication instructions in preparation for your procedure:   Contrast Allergy: No  On the morning of your procedure, take your Aspirin and any morning medicines NOT listed above.  You may use sips of water.  5. Plan for one night stay--bring personal belongings. 6. Bring a current list of your medications and current insurance cards. 7. You MUST have a responsible person to drive you home. 8. Someone MUST be with you the first 24 hours after you arrive home or your discharge will be delayed. 9. Please wear clothes that are easy to get on and off and wear slip-on shoes.  Thank you for allowing [place] to care for you!   -- [place] [facility]    Follow-Up: At [facility], you and your health needs are our priority.  As part of our continuing mission to provide you with exceptional heart care, we have created designated Provider Care Teams.  These Care Teams include your primary Cardiologist (physician) and Advanced Practice Providers (APPs -  Physician Assistants and Nurse Practitioners) who all work together to provide you with the care you  need, when you need it.  We recommend signing up for the patient portal called "MyChart".  Sign up information is provided on this After Visit Summary.  MyChart is used to connect with patients for Virtual Visits (Telemedicine).  Patients are able to view lab/test results, encounter notes, upcoming appointments, etc.  Non-urgent messages can be sent to your provider as well.   To learn more about what you can do with MyChart, go to [email or web address].    Your next appointment:   2 week(s) after Cath The format for your next appointment:   In Person  Provider:   Thomasene Ripple, DO   Other Instructions    2 weeks post cath with Dr. Dulce Sellar.   Adopting a Healthy Lifestyle.  Know what a healthy weight is for you (roughly BMI <25) and aim to maintain this   Aim for 7+ servings of fruits and vegetables daily   65-80+ fluid ounces of water or unsweet tea for healthy kidneys   Limit to max 1 drink of alcohol per day; avoid smoking/tobacco   Limit animal fats in diet for cholesterol and heart health - choose grass fed whenever available   Avoid highly processed foods, and foods high in saturated/trans fats   Aim for low stress - take time to unwind and care for your mental health   Aim for 150 min of moderate intensity exercise weekly for heart health, and weights twice weekly for bone health   Aim for 7-9 hours of sleep daily   When it comes to diets, agreement about the perfect plan isnt easy to find, even among the experts. Experts at the [facility] [employer] developed an idea known as the Healthy Eating Plate. Just imagine a plate divided into logical, healthy portions.   The emphasis is on diet quality:   Load up on vegetables and  fruits - one-half of your plate: Aim for color and variety, and remember that potatoes dont count.   Go for whole grains - one-quarter of your plate: Whole wheat, barley, wheat berries, quinoa, oats, brown rice, and foods made with  them. If you want pasta, go with whole wheat pasta.   Protein power - one-quarter of your plate: Fish, chicken, beans, and nuts are all healthy, versatile protein sources. Limit red meat.   The diet, however, does go beyond the plate, offering a few other suggestions.   Use healthy plant oils, such as olive, canola, soy, corn, sunflower and peanut. Check the labels, and avoid partially hydrogenated oil, which have unhealthy trans fats.   If youre thirsty, drink water. Coffee and tea are good in moderation, but skip sugary drinks and limit milk and dairy products to one or two daily servings.   The type of carbohydrate in the diet is more important than the amount. Some sources of carbohydrates, such as vegetables, fruits, whole grains, and beans-are healthier than others.   Finally, stay active  Signed, Thomasene Ripple, DO  08/25/2020 11:53 AM    [facility]

## 2020-08-25 NOTE — Patient Instructions (Signed)
Medication Instructions:  Your physician recommends that you continue on your current medications as directed. Please refer to the Current Medication list given to you today.  *If you need a refill on your cardiac medications before your next appointment, please call your pharmacy*   Lab Work: Your physician recommends that you return for lab work:  BMET, Mag, CBC If you have labs (blood work) drawn today and your tests are completely normal, you will receive your results only by: Marland Kitchen MyChart Message (if you have MyChart) OR . A paper copy in the mail If you have any lab test that is abnormal or we need to change your treatment, we will call you to review the results.   Testing/Procedures:    [facility] CARDIOVASCULAR DIVISION [facility] AT [place] [address] [place] [address] Dept: [phone] Loc: [phone]  Derek [name]  08/25/2020  You are scheduled for a Cardiac Catheterization on Friday, April 15 with Dr. Tonny Bollman.  1. Please arrive at the [facility] (Main Entrance A) at [facility]: [address] [place], [place] [address] at 9:00 AM (This time is two hours before your procedure to ensure your preparation). Free valet parking service is available.   Special note: Every effort is made to have your procedure done on time. Please understand that emergencies sometimes delay scheduled procedures.  2. Diet: Do not eat solid foods after midnight.  The patient may have clear liquids until 5am upon the day of the procedure.  3. Labs: You will need to have blood drawn on Monday, April 11 at [facility]: [address] [place] . You do not need to be fasting.  4. Medication instructions in preparation for your procedure:   Contrast Allergy: No  On the morning of your procedure, take your Aspirin and any morning medicines NOT listed above.  You may use sips of water.  5. Plan for one night stay--bring personal  belongings. 6. Bring a current list of your medications and current insurance cards. 7. You MUST have a responsible person to drive you home. 8. Someone MUST be with you the first 24 hours after you arrive home or your discharge will be delayed. 9. Please wear clothes that are easy to get on and off and wear slip-on shoes.  Thank you for allowing [place] to care for you!   -- [place] [facility]    Follow-Up: At [facility], you and your health needs are our priority.  As part of our continuing mission to provide you with exceptional heart care, we have created designated Provider Care Teams.  These Care Teams include your primary Cardiologist (physician) and Advanced Practice Providers (APPs -  Physician Assistants and Nurse Practitioners) who all work together to provide you with the care you need, when you need it.  We recommend signing up for the patient portal called "MyChart".  Sign up information is provided on this After Visit Summary.  MyChart is used to connect with patients for Virtual Visits (Telemedicine).  Patients are able to view lab/test results, encounter notes, upcoming appointments, etc.  Non-urgent messages can be sent to your provider as well.   To learn more about what you can do with MyChart, go to [email or web address].    Your next appointment:   2 week(s) after Cath The format for your next appointment:   In Person  Provider:   Thomasene Ripple, DO   Other Instructions

## 2020-08-25 NOTE — H&P (View-Only) (Signed)
Cardiology Office Note:    Date:  08/25/2020   ID:  Derek [name], DOB 03/22/1941, MRN [id number]  PCP:  Ninfa Meeker, FNP  Cardiologist:  No primary care provider on file.  Electrophysiologist:  None   Referring MD: Noni Saupe, MD   "I am very fatigued and short of breath and I feel like I am dying"  History of Present Illness:    Derek [name] is a 80 y.o. male with a hx of hypertension, hyperlipidemia, ulcerative colitis, peripheral artery disease with previous lower extremity revascularization is here today for follow-up visit.  The patient last saw Dr. Dulce Sellar back in February 2022 at that time he had symptoms and was set up for my review.  In the interim the patient was able to get this done which did not show any evidence of ischemia. He is here today for follow-up visit.  Patient tells me he is experiencing significant fatigue, he short of breath, he feels as if every  day he wakes up he is going to die.  Past Medical History:  Diagnosis Date  . Androgen deficiency   . Arthritis   . Atherosclerosis of native arteries of the extremities with intermittent claudication 02/04/2014  . B12 deficiency   . Claudication (HCC)   . COPD (chronic obstructive pulmonary disease) (HCC)   . COPD GOLD II     Pfts 07/15/13:  FeV1 1.46 (56%)  59% ratio 68   RV/TLC 145%  dlco  59%> 66%    . Degenerative arthritis   . Essential hypertension   . Hypertension   . IBS (irritable bowel syndrome)   . Obese   . PAD (peripheral artery disease) (HCC)   . Pulmonary nodule   . Pure hypercholesterolemia   . Ulcerative colitis [facility])     Past Surgical History:  Procedure Laterality Date  . ABDOMINAL AORTOGRAM W/LOWER EXTREMITY N/A 10/30/2017   Procedure: ABDOMINAL AORTOGRAM W/LOWER EXTREMITY;  Surgeon: Maeola Harman, MD;  Location: [facility] INVASIVE CV LAB;  Service: Cardiovascular;  Laterality: N/A;  . AORTA - ILIAC ARTERY BYPASS GRAFT    . PERIPHERAL VASCULAR INTERVENTION   10/30/2017   Procedure: PERIPHERAL VASCULAR INTERVENTION;  Surgeon: Maeola Harman, MD;  Location: [facility] INVASIVE CV LAB;  Service: Cardiovascular;;  LT Iliac    Current Medications: Current Meds  Medication Sig  . amLODipine (NORVASC) 5 MG tablet Take 1 tablet by mouth daily.  Marland Kitchen atorvastatin (LIPITOR) 10 MG tablet Take 10 mg by mouth at bedtime.  . clopidogrel (PLAVIX) 75 MG tablet Take 75 mg by mouth daily.  . Cyanocobalamin (VITAMIN B-12 IJ) Inject 1,000 mcg as directed every 30 (thirty) days.  . hydrochlorothiazide (HYDRODIURIL) 12.5 MG tablet Take 1 tablet by mouth daily.  . mesalamine (LIALDA) 1.2 g EC tablet Take 4.8 g by mouth every morning.  . metoprolol succinate (TOPROL XL) 25 MG 24 hr tablet Take 0.5 tablets (12.5 mg total) by mouth daily.  . Multiple Vitamin (MULTIVITAMIN) tablet Take 1 tablet by mouth daily.  . ramelteon (ROZEREM) 8 MG tablet Take 8 mg by mouth at bedtime.  . sacubitril-valsartan (ENTRESTO) 49-51 MG Take 1 tablet by mouth 2 (two) times daily.  Monte Fantasia INHUB 100-50 MCG/DOSE AEPB Inhale 1 puff into the lungs 2 (two) times daily.  . Zinc Oxide 10 % OINT Apply 1 application topically as needed (burning).     Allergies:   Aleve [naproxen sodium]   Social History   Socioeconomic History  .  Marital status: Married    Spouse name: Not on file  . Number of children: Not on file  . Years of education: Not on file  . Highest education level: Not on file  Occupational History  . Occupation: Retired  Tobacco Use  . Smoking status: Former Smoker    Packs/day: 2.50    Years: 46.00    Pack years: 115.00    Types: Cigarettes    Quit date: 05/23/1996    Years since quitting: 24.2  . Smokeless tobacco: Never Used  Vaping Use  . Vaping Use: Never used  Substance and Sexual Activity  . Alcohol use: Yes    Alcohol/week: 3.0 standard drinks    Types: 3 Cans of beer per week  . Drug use: No  . Sexual activity: Not on file  Other Topics Concern  . Not on  file  Social History Narrative  . Not on file   Social Determinants of Health   Financial Resource Strain: Not on file  Food Insecurity: Not on file  Transportation Needs: Not on file  Physical Activity: Not on file  Stress: Not on file  Social Connections: Not on file     Family History: The patient's family history includes Alzheimer's disease in his father; Dementia in his mother.  ROS:   Review of Systems  Constitution: Negative for decreased appetite, fever and weight gain.  HENT: Negative for congestion, ear discharge, hoarse voice and sore throat.   Eyes: Negative for discharge, redness, vision loss in right eye and visual halos.  Cardiovascular: Reports chest pain, dyspnea on exertion and fatigue.  Negative for leg swelling, orthopnea and palpitations.  Respiratory: Negative for cough, hemoptysis, shortness of breath and snoring.   Endocrine: Negative for heat intolerance and polyphagia.  Hematologic/Lymphatic: Negative for bleeding problem. Does not bruise/bleed easily.  Skin: Negative for flushing, nail changes, rash and suspicious lesions.  Musculoskeletal: Negative for arthritis, joint pain, muscle cramps, myalgias, neck pain and stiffness.  Gastrointestinal: Negative for abdominal pain, bowel incontinence, diarrhea and excessive appetite.  Genitourinary: Negative for decreased libido, genital sores and incomplete emptying.  Neurological: Negative for brief paralysis, focal weakness, headaches and loss of balance.  Psychiatric/Behavioral: Negative for altered mental status, depression and suicidal ideas.  Allergic/Immunologic: Negative for HIV exposure and persistent infections.    EKGs/Labs/Other Studies Reviewed:    The following studies were reviewed today:   EKG: None today    Recent Labs: 07/03/2020: BUN 11; Creatinine, Ser 0.86; NT-Pro BNP 2,226; Potassium 3.9; Sodium 139  Recent Lipid Panel No results found for: CHOL, TRIG, HDL, CHOLHDL, VLDL, LDLCALC,  LDLDIRECT  Physical Exam:    VS:  BP 128/62   Pulse 62   Ht 5\' 11"  (1.803 m)   Wt 189 lb 12.8 oz (86.1 kg)   SpO2 97%   BMI 26.47 kg/m     Wt Readings from Last 3 Encounters:  08/25/20 189 lb 12.8 oz (86.1 kg)  07/15/20 201 lb (91.2 kg)  07/03/20 201 lb 9.6 oz (91.4 kg)     GEN: Well nourished, well developed in no acute distress HEENT: Normal NECK: No JVD; No carotid bruits LYMPHATICS: No lymphadenopathy CARDIAC: S1S2 noted,RRR, late systolic decrescendo murmurs, rubs, gallops RESPIRATORY:  Clear to auscultation without rales, wheezing or rhonchi  ABDOMEN: Soft, non-tender, non-distended, +bowel sounds, no guarding. EXTREMITIES: No edema, No cyanosis, no clubbing MUSCULOSKELETAL:  No deformity  SKIN: Warm and dry NEUROLOGIC:  Alert and oriented x 3, non-focal PSYCHIATRIC:  Normal affect, good  insight  ASSESSMENT:    1. Severe low-flow low gradient aortic stenosis   2. SOB (shortness of breath)   3. Essential hypertension   4. PAD (peripheral artery disease) (HCC)   5. Depressed left ventricular ejection fraction   6. Hypertension, unspecified type   7. Vitamin D deficiency    PLAN:    His shortness of breath and fatigue.  Could be multifactorial.  I was able to review his echocardiogram which was done in December 2021 independently, his aortic valve is severely calcified with valve area by VTI 0.89, mean gradient 11, peak velocity 2.29 m/s, dimensionless index 0.28 and stroke-volume index 21, index aortic valve area 0.43 all suggesting low-flow low gradient aortic stenosis.  What I like to do at this time given his symptomatology send patient for a left heart catheterization which showed not only assess his coronary arteries but also have the gradients across his valve was assessed as well.  I was able to speak to the patient about this testing he is agreeable to proceed with this.  The patient understands that risks include but are not limited to stroke (1 in 1000),  death (1 in 1000), kidney failure [usually temporary] (1 in 500), bleeding (1 in 200), allergic reaction [possibly serious] (1 in 200), and agrees to proceed.  He will remain on his Plavix 75 mg daily for now.  His blood pressure is acceptable continue patient on amlodipine 5 mg, hydrochlorothiazide 12.5 mg, Toprol-XL 12.5 and Entresto 49-51 mg.   In terms of his cardiomyopathy his recent EF was 30 to 35% he is on Entresto as well as Toprol-XL.    Recent labs from his PCP office show a vitamin D level of 17 he has since been repleted by his PCP.  The patient is in agreement with the above plan. The patient left the office in stable condition.  The patient will follow up in 2 weeks with Dr. Dulce Sellar   Medication Adjustments/Labs and Tests Ordered: Current medicines are reviewed at length with the patient today.  Concerns regarding medicines are outlined above.  Orders Placed This Encounter  Procedures  . Basic metabolic panel  . Magnesium  . CBC with Differential/Platelet   No orders of the defined types were placed in this encounter.   Patient Instructions  Medication Instructions:  Your physician recommends that you continue on your current medications as directed. Please refer to the Current Medication list given to you today.  *If you need a refill on your cardiac medications before your next appointment, please call your pharmacy*   Lab Work: Your physician recommends that you return for lab work:  BMET, Mag, CBC If you have labs (blood work) drawn today and your tests are completely normal, you will receive your results only by: Marland Kitchen MyChart Message (if you have MyChart) OR . A paper copy in the mail If you have any lab test that is abnormal or we need to change your treatment, we will call you to review the results.   Testing/Procedures:    [facility] CARDIOVASCULAR DIVISION [facility] [place] [address] [place]  [address] Dept: [phone] Loc: [phone]  MCIHAEL HINDERMAN  08/25/2020  You are scheduled for a Cardiac Catheterization on Friday, April 15 with Dr. Tonny Bollman.  1. Please arrive at the [facility] (Main Entrance A) at [facility]: [address] [place], [place] [address] at 9:00 AM (This time is two hours before your procedure  to ensure your preparation). Free valet parking service is available.   Special note: Every effort is made to have your procedure done on time. Please understand that emergencies sometimes delay scheduled procedures.  2. Diet: Do not eat solid foods after midnight.  The patient may have clear liquids until 5am upon the day of the procedure.  3. Labs: You will need to have blood drawn on Monday, April 11 at [facility]: [address] [place] . You do not need to be fasting.  4. Medication instructions in preparation for your procedure:   Contrast Allergy: No  On the morning of your procedure, take your Aspirin and any morning medicines NOT listed above.  You may use sips of water.  5. Plan for one night stay--bring personal belongings. 6. Bring a current list of your medications and current insurance cards. 7. You MUST have a responsible person to drive you home. 8. Someone MUST be with you the first 24 hours after you arrive home or your discharge will be delayed. 9. Please wear clothes that are easy to get on and off and wear slip-on shoes.  Thank you for allowing [place] to care for you!   -- [facility]    Follow-Up: At [facility], you and your health needs are our priority.  As part of our continuing mission to provide you with exceptional heart care, we have created designated Provider Care Teams.  These Care Teams include your primary Cardiologist (physician) and Advanced Practice Providers (APPs -  Physician Assistants and Nurse Practitioners) who all work together to provide you with the care you  need, when you need it.  We recommend signing up for the patient portal called "MyChart".  Sign up information is provided on this After Visit Summary.  MyChart is used to connect with patients for Virtual Visits (Telemedicine).  Patients are able to view lab/test results, encounter notes, upcoming appointments, etc.  Non-urgent messages can be sent to your provider as well.   To learn more about what you can do with MyChart, go to [email or web address].    Your next appointment:   2 week(s) after Cath The format for your next appointment:   In Person  Provider:   Thomasene Ripple, DO   Other Instructions    2 weeks post cath with Dr. Dulce Sellar.   Adopting a Healthy Lifestyle.  Know what a healthy weight is for you (roughly BMI <25) and aim to maintain this   Aim for 7+ servings of fruits and vegetables daily   65-80+ fluid ounces of water or unsweet tea for healthy kidneys   Limit to max 1 drink of alcohol per day; avoid smoking/tobacco   Limit animal fats in diet for cholesterol and heart health - choose grass fed whenever available   Avoid highly processed foods, and foods high in saturated/trans fats   Aim for low stress - take time to unwind and care for your mental health   Aim for 150 min of moderate intensity exercise weekly for heart health, and weights twice weekly for bone health   Aim for 7-9 hours of sleep daily   When it comes to diets, agreement about the perfect plan isnt easy to find, even among the experts. Experts at the [facility] [employer] developed an idea known as the Healthy Eating Plate. Just imagine a plate divided into logical, healthy portions.   The emphasis is on diet quality:   Load up on vegetables and  fruits - one-half of your plate: Aim for color and variety, and remember that potatoes dont count.   Go for whole grains - one-quarter of your plate: Whole wheat, barley, wheat berries, quinoa, oats, brown rice, and foods made with  them. If you want pasta, go with whole wheat pasta.   Protein power - one-quarter of your plate: Fish, chicken, beans, and nuts are all healthy, versatile protein sources. Limit red meat.   The diet, however, does go beyond the plate, offering a few other suggestions.   Use healthy plant oils, such as olive, canola, soy, corn, sunflower and peanut. Check the labels, and avoid partially hydrogenated oil, which have unhealthy trans fats.   If youre thirsty, drink water. Coffee and tea are good in moderation, but skip sugary drinks and limit milk and dairy products to one or two daily servings.   The type of carbohydrate in the diet is more important than the amount. Some sources of carbohydrates, such as vegetables, fruits, whole grains, and beans-are healthier than others.   Finally, stay active  Signed, Thomasene Ripple, DO  08/25/2020 11:53 AM    [facility]

## 2020-08-26 DIAGNOSIS — R197 Diarrhea, unspecified: Secondary | ICD-10-CM | POA: Diagnosis not present

## 2020-09-01 ENCOUNTER — Telehealth: Payer: Self-pay | Admitting: *Deleted

## 2020-09-01 DIAGNOSIS — Z79899 Other long term (current) drug therapy: Secondary | ICD-10-CM | POA: Diagnosis not present

## 2020-09-01 NOTE — Telephone Encounter (Signed)
Pt contacted pre-catheterization scheduled at [facility] for: Friday September 04, 2020 11 AM Verified arrival time and place: [facility] Main Entrance A [facility]) at: 9 AM   No solid food after midnight prior to cath, clear liquids until 5 AM day of procedure.  Hold: Lasix-AM of procedure   Except hold medications AM meds can be  taken pre-cath with sips of water including: ASA 81 mg Plavix 75 mg  Confirmed patient has responsible adult to drive home post procedure and be with patient first 24 hours after arriving home: yes  You are allowed ONE visitor in the waiting room during the time you are at the hospital for your procedure. Both you and your visitor must wear a mask once you enter the hospital.    Reviewed procedure/mask/visitor instructions with patient.

## 2020-09-02 ENCOUNTER — Other Ambulatory Visit (HOSPITAL_COMMUNITY)
Admission: RE | Admit: 2020-09-02 | Discharge: 2020-09-02 | Disposition: A | Discharge status: Home / Self Care | Payer: Medicare Other | Source: Ambulatory Visit | Attending: Cardiovascular Disease | Admitting: Cardiovascular Disease

## 2020-09-02 DIAGNOSIS — Z01812 Encounter for preprocedural laboratory examination: Secondary | ICD-10-CM | POA: Insufficient documentation

## 2020-09-02 DIAGNOSIS — Z20822 Contact with and (suspected) exposure to covid-19: Secondary | ICD-10-CM | POA: Insufficient documentation

## 2020-09-02 DIAGNOSIS — M2550 Pain in unspecified joint: Secondary | ICD-10-CM | POA: Diagnosis not present

## 2020-09-02 LAB — SARS CORONAVIRUS 2 (TAT 6-24 HRS): SARS Coronavirus 2: NEGATIVE

## 2020-09-03 NOTE — Telephone Encounter (Addendum)
BMP/CBC 09/01/20 done at [facility] scanned to Epic Lab tab.

## 2020-09-04 ENCOUNTER — Ambulatory Visit (HOSPITAL_COMMUNITY)
Admission: RE | Admit: 2020-09-04 | Discharge: 2020-09-04 | Disposition: A | Discharge status: Home / Self Care | Payer: Medicare Other | Attending: Cardiovascular Disease | Admitting: Cardiovascular Disease

## 2020-09-04 ENCOUNTER — Encounter (HOSPITAL_COMMUNITY)
Admission: RE | Disposition: A | Discharge status: Home / Self Care | Payer: Self-pay | Source: Home / Self Care | Attending: Cardiovascular Disease

## 2020-09-04 ENCOUNTER — Other Ambulatory Visit: Payer: Self-pay

## 2020-09-04 DIAGNOSIS — Z79899 Other long term (current) drug therapy: Secondary | ICD-10-CM | POA: Diagnosis not present

## 2020-09-04 DIAGNOSIS — Z886 Allergy status to analgesic agent status: Secondary | ICD-10-CM | POA: Diagnosis not present

## 2020-09-04 DIAGNOSIS — I350 Nonrheumatic aortic (valve) stenosis: Secondary | ICD-10-CM | POA: Diagnosis not present

## 2020-09-04 DIAGNOSIS — I2510 Atherosclerotic heart disease of native coronary artery without angina pectoris: Secondary | ICD-10-CM | POA: Diagnosis not present

## 2020-09-04 DIAGNOSIS — I10 Essential (primary) hypertension: Secondary | ICD-10-CM | POA: Insufficient documentation

## 2020-09-04 DIAGNOSIS — Z87891 Personal history of nicotine dependence: Secondary | ICD-10-CM | POA: Insufficient documentation

## 2020-09-04 DIAGNOSIS — Z7902 Long term (current) use of antithrombotics/antiplatelets: Secondary | ICD-10-CM | POA: Diagnosis not present

## 2020-09-04 DIAGNOSIS — E559 Vitamin D deficiency, unspecified: Secondary | ICD-10-CM | POA: Diagnosis not present

## 2020-09-04 DIAGNOSIS — I2582 Chronic total occlusion of coronary artery: Secondary | ICD-10-CM | POA: Insufficient documentation

## 2020-09-04 DIAGNOSIS — I739 Peripheral vascular disease, unspecified: Secondary | ICD-10-CM | POA: Insufficient documentation

## 2020-09-04 DIAGNOSIS — R0602 Shortness of breath: Secondary | ICD-10-CM | POA: Diagnosis not present

## 2020-09-04 DIAGNOSIS — I5022 Chronic systolic (congestive) heart failure: Secondary | ICD-10-CM | POA: Diagnosis not present

## 2020-09-04 HISTORY — PX: RIGHT/LEFT HEART CATH AND CORONARY ANGIOGRAPHY: CATH118266

## 2020-09-04 LAB — POCT I-STAT EG7
Acid-base deficit: 1 mmol/L (ref 0.0–2.0)
Bicarbonate: 25.3 mmol/L (ref 20.0–28.0)
Calcium, Ion: 1.21 mmol/L (ref 1.15–1.40)
HCT: 39 % (ref 39.0–52.0)
Hemoglobin: 13.3 g/dL (ref 13.0–17.0)
O2 Saturation: 75 %
Potassium: 3.5 mmol/L (ref 3.5–5.1)
Sodium: 141 mmol/L (ref 135–145)
TCO2: 27 mmol/L (ref 22–32)
pCO2, Ven: 45.9 mmHg (ref 44.0–60.0)
pH, Ven: 7.349 (ref 7.250–7.430)
pO2, Ven: 42 mmHg (ref 32.0–45.0)

## 2020-09-04 LAB — POCT I-STAT 7, (LYTES, BLD GAS, ICA,H+H)
Acid-base deficit: 2 mmol/L (ref 0.0–2.0)
Acid-base deficit: 2 mmol/L (ref 0.0–2.0)
Bicarbonate: 23.7 mmol/L (ref 20.0–28.0)
Bicarbonate: 23.9 mmol/L (ref 20.0–28.0)
Calcium, Ion: 1.24 mmol/L (ref 1.15–1.40)
Calcium, Ion: 1.25 mmol/L (ref 1.15–1.40)
HCT: 37 % — ABNORMAL LOW (ref 39.0–52.0)
HCT: 38 % — ABNORMAL LOW (ref 39.0–52.0)
Hemoglobin: 12.6 g/dL — ABNORMAL LOW (ref 13.0–17.0)
Hemoglobin: 12.9 g/dL — ABNORMAL LOW (ref 13.0–17.0)
O2 Saturation: 94 %
O2 Saturation: 94 %
Potassium: 3.5 mmol/L (ref 3.5–5.1)
Potassium: 3.5 mmol/L (ref 3.5–5.1)
Sodium: 140 mmol/L (ref 135–145)
Sodium: 140 mmol/L (ref 135–145)
TCO2: 25 mmol/L (ref 22–32)
TCO2: 25 mmol/L (ref 22–32)
pCO2 arterial: 41.5 mmHg (ref 32.0–48.0)
pCO2 arterial: 42.4 mmHg (ref 32.0–48.0)
pH, Arterial: 7.359 (ref 7.350–7.450)
pH, Arterial: 7.365 (ref 7.350–7.450)
pO2, Arterial: 72 mmHg — ABNORMAL LOW (ref 83.0–108.0)
pO2, Arterial: 74 mmHg — ABNORMAL LOW (ref 83.0–108.0)

## 2020-09-04 SURGERY — RIGHT/LEFT HEART CATH AND CORONARY ANGIOGRAPHY
Anesthesia: LOCAL

## 2020-09-04 MED ORDER — ACETAMINOPHEN 325 MG PO TABS: 650.0000 mg | ORAL_TABLET | ORAL | Status: DC | PRN

## 2020-09-04 MED ORDER — HEPARIN (PORCINE) IN NACL 1000-0.9 UT/500ML-% IV SOLN
INTRAVENOUS | Status: AC
  Filled 2020-09-04: qty 500

## 2020-09-04 MED ORDER — SODIUM CHLORIDE 0.9% FLUSH: 3.0000 mL | INTRAVENOUS | Status: DC | PRN

## 2020-09-04 MED ORDER — HYDRALAZINE HCL 20 MG/ML IJ SOLN: 10.0000 mg | INTRAMUSCULAR | Status: DC | PRN

## 2020-09-04 MED ORDER — ONDANSETRON HCL 4 MG/2ML IJ SOLN: 4.0000 mg | Freq: Four times a day (QID) | INTRAMUSCULAR | Status: DC | PRN

## 2020-09-04 MED ORDER — SODIUM CHLORIDE 0.9 % WEIGHT BASED INFUSION: 1.0000 mL/kg/h | INTRAVENOUS | Status: DC

## 2020-09-04 MED ORDER — ASPIRIN 81 MG PO CHEW: 81.0000 mg | CHEWABLE_TABLET | ORAL | Status: DC

## 2020-09-04 MED ORDER — LABETALOL HCL 5 MG/ML IV SOLN: 10.0000 mg | INTRAVENOUS | Status: DC | PRN

## 2020-09-04 MED ORDER — HEPARIN (PORCINE) IN NACL 1000-0.9 UT/500ML-% IV SOLN
INTRAVENOUS | Status: DC | PRN
  Administered 2020-09-04 (×2): 500 mL

## 2020-09-04 MED ORDER — FENTANYL CITRATE (PF) 100 MCG/2ML IJ SOLN
INTRAMUSCULAR | Status: DC | PRN
  Administered 2020-09-04: 25 ug via INTRAVENOUS

## 2020-09-04 MED ORDER — IOHEXOL 350 MG/ML SOLN
INTRAVENOUS | Status: DC | PRN
  Administered 2020-09-04: 75 mL

## 2020-09-04 MED ORDER — HEPARIN SODIUM (PORCINE) 1000 UNIT/ML IJ SOLN
INTRAMUSCULAR | Status: DC | PRN
  Administered 2020-09-04: 4000 [IU] via INTRAVENOUS

## 2020-09-04 MED ORDER — SODIUM CHLORIDE 0.9 % IV SOLN: INTRAVENOUS | Status: DC

## 2020-09-04 MED ORDER — CLOPIDOGREL BISULFATE 75 MG PO TABS: 75.0000 mg | ORAL_TABLET | ORAL | Status: DC

## 2020-09-04 MED ORDER — LIDOCAINE HCL (PF) 1 % IJ SOLN
INTRAMUSCULAR | Status: AC
  Filled 2020-09-04: qty 30

## 2020-09-04 MED ORDER — SODIUM CHLORIDE 0.9% FLUSH: 3.0000 mL | Freq: Two times a day (BID) | INTRAVENOUS | Status: DC

## 2020-09-04 MED ORDER — VERAPAMIL HCL 2.5 MG/ML IV SOLN
INTRAVENOUS | Status: AC
  Filled 2020-09-04: qty 2

## 2020-09-04 MED ORDER — MIDAZOLAM HCL 2 MG/2ML IJ SOLN
INTRAMUSCULAR | Status: AC
  Filled 2020-09-04: qty 2

## 2020-09-04 MED ORDER — HEPARIN (PORCINE) IN NACL 2-0.9 UNITS/ML
INTRAMUSCULAR | Status: DC | PRN
  Administered 2020-09-04: 10 mL via INTRA_ARTERIAL

## 2020-09-04 MED ORDER — SODIUM CHLORIDE 0.9 % IV SOLN: 250.0000 mL | INTRAVENOUS | Status: DC | PRN

## 2020-09-04 MED ORDER — MIDAZOLAM HCL 2 MG/2ML IJ SOLN
INTRAMUSCULAR | Status: DC | PRN
  Administered 2020-09-04: 2 mg via INTRAVENOUS

## 2020-09-04 MED ORDER — LIDOCAINE HCL (PF) 1 % IJ SOLN
INTRAMUSCULAR | Status: DC | PRN
  Administered 2020-09-04: 5 mL

## 2020-09-04 MED ORDER — FENTANYL CITRATE (PF) 100 MCG/2ML IJ SOLN
INTRAMUSCULAR | Status: AC
  Filled 2020-09-04: qty 2

## 2020-09-04 MED ORDER — HEPARIN SODIUM (PORCINE) 1000 UNIT/ML IJ SOLN
INTRAMUSCULAR | Status: AC
  Filled 2020-09-04: qty 1

## 2020-09-04 SURGICAL SUPPLY — 12 items
CATH 5FR JL3.5 JR4 ANG PIG MP (CATHETERS) ×1 IMPLANT
CATH SWAN GANZ 7F STRAIGHT (CATHETERS) ×1 IMPLANT
GLIDESHEATH SLEND SS 6F .021 (SHEATH) ×1 IMPLANT
GLIDESHEATH SLENDER 7FR .021G (SHEATH) ×1 IMPLANT
GUIDEWIRE .025 260CM (WIRE) ×1 IMPLANT
GUIDEWIRE INQWIRE 1.5J.035X260 (WIRE) ×1 IMPLANT
INQWIRE 1.5J .035X260CM (WIRE) ×2
KIT HEART LEFT (KITS) ×2 IMPLANT
PACK CARDIAC CATHETERIZATION (CUSTOM PROCEDURE TRAY) ×2 IMPLANT
SYR MEDRAD MARK 7 150ML (SYRINGE) ×2 IMPLANT
TRANSDUCER W/STOPCOCK (MISCELLANEOUS) ×2 IMPLANT
TUBING CIL FLEX 10 FLL-RA (TUBING) ×2 IMPLANT

## 2020-09-04 NOTE — Research (Signed)
Identfy Informed Consent   Subject Name: Derek [name]  Subject met inclusion and exclusion criteria.  The informed consent form, study requirements and expectations were reviewed with the subject and questions and concerns were addressed prior to the signing of the consent form.  The subject verbalized understanding of the trial requirements.  The subject agreed to participate in the Identify trial and signed the informed consent at [place] on 09/04/20.  The informed consent was obtained prior to performance of any protocol-specific procedures for the subject.  A copy of the signed informed consent was given to the subject and a copy was placed in the subject's medical record.   [name]

## 2020-09-04 NOTE — Discharge Instructions (Signed)
Radial Site Care  This sheet gives you information about how to care for yourself after your procedure. Your health care provider may also give you more specific instructions. If you have problems or questions, contact your health care provider. What can I expect after the procedure? After the procedure, it is common to have:  Bruising and tenderness at the catheter insertion area. Follow these instructions at home: Medicines  Take over-the-counter and prescription medicines only as told by your health care provider. Insertion site care  Follow instructions from your health care provider about how to take care of your insertion site. Make sure you: ? Wash your hands with soap and water before you change your bandage (dressing). If soap and water are not available, use hand sanitizer. ? Change your dressing as told by your health care provider. ? Leave stitches (sutures), skin glue, or adhesive strips in place. These skin closures may need to stay in place for 2 weeks or longer. If adhesive strip edges start to loosen and curl up, you may trim the loose edges. Do not remove adhesive strips completely unless your health care provider tells you to do that.  Check your insertion site every day for signs of infection. Check for: ? Redness, swelling, or pain. ? Fluid or blood. ? Pus or a bad smell. ? Warmth.  Do not take baths, swim, or use a hot tub until your health care provider approves.  You may shower 24-48 hours after the procedure, or as directed by your health care provider. ? Remove the dressing and gently wash the site with plain soap and water. ? Pat the area dry with a clean towel. ? Do not rub the site. That could cause bleeding.  Do not apply powder or lotion to the site. Activity  For 24 hours after the procedure, or as directed by your health care provider: ? Do not flex or bend the affected arm. ? Do not push or pull heavy objects with the affected arm. ? Do not drive  yourself home from the hospital or clinic. You may drive 24 hours after the procedure unless your health care provider tells you not to. ? Do not operate machinery or power tools.  Do not lift anything that is heavier than 10 lb (4.5 kg), or the limit that you are told, until your health care provider says that it is safe.  Ask your health care provider when it is okay to: ? Return to work or school. ? Resume usual physical activities or sports. ? Resume sexual activity.   General instructions  If the catheter site starts to bleed, raise your arm and put firm pressure on the site. If the bleeding does not stop, get help right away. This is a medical emergency.  If you went home on the same day as your procedure, a responsible adult should be with you for the first 24 hours after you arrive home.  Keep all follow-up visits as told by your health care provider. This is important. Contact a health care provider if:  You have a fever.  You have redness, swelling, or yellow drainage around your insertion site. Get help right away if:  You have unusual pain at the radial site.  The catheter insertion area swells very fast.  The insertion area is bleeding, and the bleeding does not stop when you hold steady pressure on the area.  Your arm or hand becomes pale, cool, tingly, or numb. These symptoms may represent a serious   problem that is an emergency. Do not wait to see if the symptoms will go away. Get medical help right away. Call your local emergency services (911 in the [place] Do not drive yourself to the hospital. Summary  After the procedure, it is common to have bruising and tenderness at the site.  Follow instructions from your health care provider about how to take care of your radial site wound. Check the wound every day for signs of infection.  Do not lift anything that is heavier than 10 lb (4.5 kg), or the limit that you are told, until your health care provider says that it  is safe. This information is not intended to replace advice given to you by your health care provider. Make sure you discuss any questions you have with your health care provider. Document Revised: 06/14/2017 Document Reviewed: 06/14/2017 Elsevier Patient Education  2021 Elsevier Inc.  

## 2020-09-04 NOTE — Interval H&P Note (Signed)
History and Physical Interval Note:  09/04/2020 10:31 AM  Derek [name]  has presented today for surgery, with the diagnosis of sob.  The various methods of treatment have been discussed with the patient and family. After consideration of risks, benefits and other options for treatment, the patient has consented to  Procedure(s): RIGHT/LEFT HEART CATH AND CORONARY ANGIOGRAPHY (N/A) as a surgical intervention.  The patient's history has been reviewed, patient examined, no change in status, stable for surgery.  I have reviewed the patient's chart and labs.  Questions were answered to the patient's satisfaction.     Tonny Bollman

## 2020-09-07 ENCOUNTER — Encounter (HOSPITAL_COMMUNITY): Payer: Self-pay | Admitting: Cardiovascular Disease

## 2020-09-14 ENCOUNTER — Ambulatory Visit (INDEPENDENT_AMBULATORY_CARE_PROVIDER_SITE_OTHER): Payer: Medicare Other

## 2020-09-14 ENCOUNTER — Other Ambulatory Visit: Payer: Self-pay | Admitting: *Deleted

## 2020-09-14 DIAGNOSIS — I493 Ventricular premature depolarization: Secondary | ICD-10-CM

## 2020-09-21 ENCOUNTER — Encounter: Payer: Self-pay | Admitting: Cardiology

## 2020-09-21 ENCOUNTER — Other Ambulatory Visit: Payer: Self-pay

## 2020-09-21 ENCOUNTER — Ambulatory Visit (INDEPENDENT_AMBULATORY_CARE_PROVIDER_SITE_OTHER): Payer: Medicare Other | Admitting: Cardiology

## 2020-09-21 VITALS — BP 116/68 | HR 84 | Ht 71.0 in | Wt 193.4 lb

## 2020-09-21 DIAGNOSIS — I739 Peripheral vascular disease, unspecified: Secondary | ICD-10-CM

## 2020-09-21 DIAGNOSIS — I10 Essential (primary) hypertension: Secondary | ICD-10-CM | POA: Diagnosis not present

## 2020-09-21 DIAGNOSIS — I350 Nonrheumatic aortic (valve) stenosis: Secondary | ICD-10-CM

## 2020-09-21 DIAGNOSIS — R0602 Shortness of breath: Secondary | ICD-10-CM

## 2020-09-21 DIAGNOSIS — R0989 Other specified symptoms and signs involving the circulatory and respiratory systems: Secondary | ICD-10-CM

## 2020-09-21 DIAGNOSIS — R931 Abnormal findings on diagnostic imaging of heart and coronary circulation: Secondary | ICD-10-CM

## 2020-09-21 HISTORY — DX: Nonrheumatic aortic (valve) stenosis: I35.0

## 2020-09-21 NOTE — Progress Notes (Signed)
Cardiology Office Note:    Date:  09/21/2020   ID:  Derek [name], DOB August 28, 1940, MRN [id number]  PCP:  Derek Meeker, FNP  Cardiologist:  No primary care provider on file.  Electrophysiologist:  None   Referring MD: Derek [name]*   I am having some shortness of breath but I think is getting better  History of Present Illness:    Derek [name] is a 80 y.o. male  with a hx of hypertension, hyperlipidemia, ulcerative colitis, peripheral artery disease with previous lower extremity revascularization is here today for follow-up visit.  I saw the patient on August 25, 2020 at that time he was experiencing worsening shortness of breath on exertion as well as some chest discomfort.  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  He was recommended for heart catheterization.  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  In the meantime a monitor was mailed out to the patient today is the seventh day and he is going to be mail it back today.  Past Medical History:  Diagnosis Date  . Androgen deficiency   . Arthritis   . Atherosclerosis of native arteries of the extremities with intermittent claudication 02/04/2014  . B12 deficiency   . Claudication (HCC)   . COPD (chronic obstructive pulmonary disease) (HCC)   . COPD GOLD II     Pfts 07/15/13:  FeV1 1.46 (56%)  59% ratio 68   RV/TLC 145%  dlco  59%> 66%    . Degenerative arthritis   . Essential hypertension   . Hypertension   . IBS (irritable bowel syndrome)   . Obese   . PAD (peripheral artery disease) (HCC)   . Pulmonary nodule   . Pure hypercholesterolemia   . Ulcerative colitis [facility])     Past Surgical History:  Procedure Laterality Date  . ABDOMINAL AORTOGRAM W/LOWER EXTREMITY N/A 10/30/2017   Procedure: ABDOMINAL AORTOGRAM W/LOWER EXTREMITY;   Surgeon: Derek Harman, MD;  Location: [facility] INVASIVE CV LAB;  Service: Cardiovascular;  Laterality: N/A;  . AORTA - ILIAC ARTERY BYPASS GRAFT    . PERIPHERAL VASCULAR INTERVENTION  10/30/2017   Procedure: PERIPHERAL VASCULAR INTERVENTION;  Surgeon: Derek Harman, MD;  Location: [facility] INVASIVE CV LAB;  Service: Cardiovascular;;  LT Iliac  . RIGHT/LEFT HEART CATH AND CORONARY ANGIOGRAPHY N/A 09/04/2020   Procedure: RIGHT/LEFT HEART CATH AND CORONARY ANGIOGRAPHY;  Surgeon: Derek Bollman, MD;  Location: [facility] INVASIVE CV LAB;  Service: Cardiovascular;  Laterality: N/A;    Current Medications: Current Meds  Medication Sig  . amoxicillin-clavulanate (AUGMENTIN XR) 1000-62.5 MG 12 hr tablet Take 1 tablet by mouth 2 (two) times daily.  Marland Kitchen atorvastatin (LIPITOR) 10 MG tablet Take 10 mg by mouth at bedtime.  . clopidogrel (PLAVIX) 75 MG tablet Take 75 mg by mouth daily.  . Cyanocobalamin (VITAMIN B-12 IJ) Inject 1,000 mcg as directed every 30 (thirty) days.  . diphenoxylate-atropine (LOMOTIL) 2.5-0.025 MG tablet Take 1 tablet by mouth 4 (four) times daily as needed for diarrhea or loose stools.  . ferrous sulfate 325 (65 FE) MG tablet Take 325 mg by mouth daily with breakfast.  . furosemide (LASIX) 20 MG tablet Take 20 mg by mouth daily.  . mesalamine (LIALDA) 1.2 g EC tablet Take 4.8 g by mouth every morning.  . metoprolol succinate (TOPROL XL) 25  MG 24 hr tablet Take 0.5 tablets (12.5 mg total) by mouth daily.  . predniSONE (DELTASONE) 10 MG tablet Take 10 mg by mouth every morning.  . ramelteon (ROZEREM) 8 MG tablet Take 8 mg by mouth at bedtime.  . sacubitril-valsartan (ENTRESTO) 49-51 MG Take 1 tablet by mouth 2 (two) times daily.  . vitamin C (ASCORBIC ACID) 500 MG tablet Take 500 mg by mouth daily.  Derek [name]. WIXELA INHUB 100-50 MCG/DOSE AEPB Inhale 1 puff into the lungs 2 (two) times daily.  . Zinc Oxide 10 % OINT Apply 1 application topically as needed (burning).     Allergies:    Aleve [naproxen sodium]   Social History   Socioeconomic History  . Marital status: Married    Spouse name: Not on file  . Number of children: Not on file  . Years of education: Not on file  . Highest education level: Not on file  Occupational History  . Occupation: Retired  Tobacco Use  . Smoking status: Former Smoker    Packs/day: 2.50    Years: 46.00    Pack years: 115.00    Types: Cigarettes    Quit date: 05/23/1996    Years since quitting: 24.3  . Smokeless tobacco: Never Used  Vaping Use  . Vaping Use: Never used  Substance and Sexual Activity  . Alcohol use: Yes    Alcohol/week: 3.0 standard drinks    Types: 3 Cans of beer per week  . Drug use: No  . Sexual activity: Not on file  Other Topics Concern  . Not on file  Social History Narrative  . Not on file   Social Determinants of Health   Financial Resource Strain: Not on file  Food Insecurity: Not on file  Transportation Needs: Not on file  Physical Activity: Not on file  Stress: Not on file  Social Connections: Not on file     Family History: The patient's family history includes Alzheimer's disease in his father; Dementia in his mother.  ROS:   Review of Systems  Constitution: Negative for decreased appetite, fever and weight gain.  HENT: Negative for congestion, ear discharge, hoarse voice and sore throat.   Eyes: Negative for discharge, redness, vision loss in right eye and visual halos.  Cardiovascular: Negative for chest pain, dyspnea on exertion, leg swelling, orthopnea and palpitations.  Respiratory: Negative for cough, hemoptysis, shortness of breath and snoring.   Endocrine: Negative for heat intolerance and polyphagia.  Hematologic/Lymphatic: Negative for bleeding problem. Does not bruise/bleed easily.  Skin: Negative for flushing, nail changes, rash and suspicious lesions.  Musculoskeletal: Negative for arthritis, joint pain, muscle cramps, myalgias, neck pain and stiffness.   Gastrointestinal: Negative for abdominal pain, bowel incontinence, diarrhea and excessive appetite.  Genitourinary: Negative for decreased libido, genital sores and incomplete emptying.  Neurological: Negative for brief paralysis, focal weakness, headaches and loss of balance.  Psychiatric/Behavioral: Negative for altered mental status, depression and suicidal ideas.  Allergic/Immunologic: Negative for HIV exposure and persistent infections.    EKGs/Labs/Other Studies Reviewed:    The following studies were reviewed today:   EKG: None today  TTE  IMPRESSIONS December 2021 1. Left ventricular ejection fraction, by estimation, is 30 to 35%. The  left ventricle has moderately decreased function. The left ventricle has  no regional wall motion abnormalities. There is mild left ventricular  hypertrophy. Left ventricular  diastolic parameters are consistent with Grade I diastolic dysfunction  (impaired relaxation). Unable to assess accurate EF secondary to frequent  ectopy.  2. Right ventricular systolic function is normal. The right ventricular  size is normal. There is normal pulmonary artery systolic pressure.  3. The mitral valve is normal in structure. Mild mitral valve  regurgitation. No evidence of mitral stenosis.  4. The aortic valve is normal in structure. Aortic valve regurgitation is  not visualized. Mild aortic valve stenosis.   FINDINGS  Left Ventricle: Left ventricular ejection fraction, by estimation, is 30 to 35%. The left ventricle has moderately decreased function. The left ventricle has no regional wall motion abnormalities. The left ventricular internal cavity size was normal in  size. There is mild left ventricular hypertrophy. Left ventricular diastolic parameters are consistent with Grade I diastolic dysfunction (impaired relaxation).   Right Ventricle: The right ventricular size is normal. No increase in right ventricular wall thickness. Right ventricular  systolic function is normal. There is normal pulmonary artery systolic pressure. The tricuspid regurgitant velocity is 2.40 m/s, and  with an assumed right atrial pressure of 3 mmHg, the estimated right ventricular systolic pressure is 26.0 mmHg.   Left Atrium: Left atrial size was normal in size.   Right Atrium: Right atrial size was normal in size.   Pericardium: There is no evidence of pericardial effusion.   Mitral Valve: The mitral valve is normal in structure. Mild mitral valve regurgitation. No evidence of mitral valve stenosis.  Tricuspid Valve: The tricuspid valve is normal in structure. Tricuspid valve regurgitation is not demonstrated. No evidence of tricuspid stenosis.   Aortic Valve: The aortic valve is normal in structure. Aortic valve regurgitation is not visualized. Mild aortic stenosis is present. Aortic valve mean gradient measures 11.0 mmHg. Aortic valve peak gradient measures 21.1 mmHg. Aortic valve area, by VTI  measures 0.89 cm.   Pulmonic Valve: The pulmonic valve was normal in structure. Pulmonic valve  regurgitation is not visualized. No evidence of pulmonic stenosis.   Aorta: The aortic root is normal in size and structure.   Venous: The inferior vena cava is normal in size with greater than 50%  respiratory variability, suggesting right atrial pressure of 3 mmHg.   IAS/Shunts: No atrial level shunt detected by color flow Doppler.     Recent Labs: 07/03/2020: BUN 11; Creatinine, Ser 0.86; NT-Pro BNP 2,226 09/04/2020: Hemoglobin 12.6; Hemoglobin 12.9; Potassium 3.5; Potassium 3.5; Sodium 140; Sodium 140  Recent Lipid Panel No results found for: CHOL, TRIG, HDL, CHOLHDL, VLDL, LDLCALC, LDLDIRECT  Physical Exam:    VS:  BP 116/68   Pulse 84   Ht 5\' 11"  (1.803 m)   Wt 193 lb 6.4 oz (87.7 kg)   SpO2 96%   BMI 26.97 kg/m     Wt Readings from Last 3 Encounters:  09/21/20 193 lb 6.4 oz (87.7 kg)  09/04/20 185 lb (83.9 kg)  08/25/20 189 lb 12.8 oz (86.1  kg)     GEN: Well nourished, well developed in no acute distress HEENT: Normal NECK: No JVD; No carotid bruits LYMPHATICS: No lymphadenopathy CARDIAC: S1S2 noted,RRR, no murmurs, rubs, gallops RESPIRATORY:  Clear to auscultation without rales, wheezing or rhonchi  ABDOMEN: Soft, non-tender, non-distended, +bowel sounds, no guarding. EXTREMITIES: No edema, No cyanosis, no clubbing MUSCULOSKELETAL:  No deformity  SKIN: Warm and dry NEUROLOGIC:  Alert and oriented x 3, non-focal PSYCHIATRIC:  Normal affect, good insight  ASSESSMENT:    1. SOB (shortness of breath)   2. PAD (peripheral artery disease) (HCC)   3. Essential hypertension   4. Depressed left ventricular ejection fraction   5.  Mild aortic stenosis    PLAN:    He tells me that his shortness of breath is improving though not resolved.  We talked about the heart catheterization.  He is going to mail his monitor where we can be able to understand his burden of PVCs. He is compensated in terms of heart failure.  No medication changes will be made today.  The patient is in agreement with the above plan. The patient left the office in stable condition.  The patient will follow up in 3 months to see Dr. Dulce Sellar   Medication Adjustments/Labs and Tests Ordered: Current medicines are reviewed at length with the patient today.  Concerns regarding medicines are outlined above.  No orders of the defined types were placed in this encounter.  No orders of the defined types were placed in this encounter.   Patient Instructions  Medication Instructions:  Your physician recommends that you continue on your current medications as directed. Please refer to the Current Medication list given to you today.  *If you need a refill on your cardiac medications before your next appointment, please call your pharmacy*   Lab Work: None If you have labs (blood work) drawn today and your tests are completely normal, you will receive your  results only by: Marland Kitchen MyChart Message (if you have MyChart) OR . A paper copy in the mail If you have any lab test that is abnormal or we need to change your treatment, we will call you to review the results.   Testing/Procedures: None   Follow-Up: At [facility], you and your health needs are our priority.  As part of our continuing mission to provide you with exceptional heart care, we have created designated Provider Care Teams.  These Care Teams include your primary Cardiologist (physician) and Advanced Practice Providers (APPs -  Physician Assistants and Nurse Practitioners) who all work together to provide you with the care you need, when you need it.  We recommend signing up for the patient portal called "MyChart".  Sign up information is provided on this After Visit Summary.  MyChart is used to connect with patients for Virtual Visits (Telemedicine).  Patients are able to view lab/test results, encounter notes, upcoming appointments, etc.  Non-urgent messages can be sent to your provider as well.   To learn more about what you can do with MyChart, go to [email or web address].    Your next appointment:   3 month(s)  The format for your next appointment:   In Person  Provider:   Norman Herrlich, MD   Other Instructions      Adopting a Healthy Lifestyle.  Know what a healthy weight is for you (roughly BMI <25) and aim to maintain this   Aim for 7+ servings of fruits and vegetables daily   65-80+ fluid ounces of water or unsweet tea for healthy kidneys   Limit to max 1 drink of alcohol per day; avoid smoking/tobacco   Limit animal fats in diet for cholesterol and heart health - choose grass fed whenever available   Avoid highly processed foods, and foods high in saturated/trans fats   Aim for low stress - take time to unwind and care for your mental health   Aim for 150 min of moderate intensity exercise weekly for heart health, and weights twice weekly for bone  health   Aim for 7-9 hours of sleep daily   When it comes to diets, agreement about the perfect plan isnt easy to find, even among the  experts. Experts at the [facility] [employer] developed an idea known as the Healthy Eating Plate. Just imagine a plate divided into logical, healthy portions.   The emphasis is on diet quality:   Load up on vegetables and fruits - one-half of your plate: Aim for color and variety, and remember that potatoes dont count.   Go for whole grains - one-quarter of your plate: Whole wheat, barley, wheat berries, quinoa, oats, brown rice, and foods made with them. If you want pasta, go with whole wheat pasta.   Protein power - one-quarter of your plate: Fish, chicken, beans, and nuts are all healthy, versatile protein sources. Limit red meat.   The diet, however, does go beyond the plate, offering a few other suggestions.   Use healthy plant oils, such as olive, canola, soy, corn, sunflower and peanut. Check the labels, and avoid partially hydrogenated oil, which have unhealthy trans fats.   If youre thirsty, drink water. Coffee and tea are good in moderation, but skip sugary drinks and limit milk and dairy products to one or two daily servings.   The type of carbohydrate in the diet is more important than the amount. Some sources of carbohydrates, such as vegetables, fruits, whole grains, and beans-are healthier than others.   Finally, stay active  Signed, Thomasene Ripple, DO  09/21/2020 12:06 PM    [facility]

## 2020-09-21 NOTE — Patient Instructions (Addendum)

## 2020-10-02 DIAGNOSIS — M2550 Pain in unspecified joint: Secondary | ICD-10-CM | POA: Diagnosis not present

## 2020-10-05 DIAGNOSIS — E538 Deficiency of other specified B group vitamins: Secondary | ICD-10-CM | POA: Diagnosis not present

## 2020-10-07 DIAGNOSIS — I493 Ventricular premature depolarization: Secondary | ICD-10-CM | POA: Diagnosis not present

## 2020-10-09 ENCOUNTER — Ambulatory Visit: Payer: Medicare Other | Admitting: Cardiology

## 2020-10-12 ENCOUNTER — Telehealth: Payer: Self-pay

## 2020-10-12 NOTE — Telephone Encounter (Signed)
Left message on patients voicemail to please return our call.   

## 2020-10-12 NOTE — Telephone Encounter (Signed)
-----   Message from Brian J Munley, MD sent at 10/12/2020 12:01 PM EDT ----- This monitor was placed after his heart catheterization.  He has a very high frequency of ventricular premature contractions and brief runs and I like him seen by EP at first opportunity especially in view of his underlying heart muscle disease.  

## 2020-10-13 ENCOUNTER — Telehealth: Payer: Self-pay

## 2020-10-13 DIAGNOSIS — I493 Ventricular premature depolarization: Secondary | ICD-10-CM

## 2020-10-13 NOTE — Telephone Encounter (Signed)
Spoke with patient regarding results and recommendation.  Patient verbalizes understanding and is agreeable to plan of care. Advised patient to call back with any issues or concerns.  

## 2020-10-13 NOTE — Telephone Encounter (Signed)
-----   Message from Baldo Daub, MD sent at 10/12/2020 12:01 PM EDT ----- This monitor was placed after his heart catheterization.  He has a very high frequency of ventricular premature contractions and brief runs and I like him seen by EP at first opportunity especially in view of his underlying heart muscle disease.

## 2020-10-26 ENCOUNTER — Telehealth: Payer: Self-pay | Admitting: Cardiology

## 2020-10-26 DIAGNOSIS — R7989 Other specified abnormal findings of blood chemistry: Secondary | ICD-10-CM | POA: Diagnosis not present

## 2020-10-26 DIAGNOSIS — E559 Vitamin D deficiency, unspecified: Secondary | ICD-10-CM | POA: Diagnosis not present

## 2020-10-26 DIAGNOSIS — R0609 Other forms of dyspnea: Secondary | ICD-10-CM | POA: Diagnosis not present

## 2020-10-26 DIAGNOSIS — D519 Vitamin B12 deficiency anemia, unspecified: Secondary | ICD-10-CM | POA: Diagnosis not present

## 2020-10-26 DIAGNOSIS — I509 Heart failure, unspecified: Secondary | ICD-10-CM | POA: Diagnosis not present

## 2020-10-26 DIAGNOSIS — R0600 Dyspnea, unspecified: Secondary | ICD-10-CM | POA: Diagnosis not present

## 2020-10-26 DIAGNOSIS — Z8639 Personal history of other endocrine, nutritional and metabolic disease: Secondary | ICD-10-CM | POA: Diagnosis not present

## 2020-10-26 DIAGNOSIS — J22 Unspecified acute lower respiratory infection: Secondary | ICD-10-CM | POA: Diagnosis not present

## 2020-10-26 DIAGNOSIS — B9689 Other specified bacterial agents as the cause of diseases classified elsewhere: Secondary | ICD-10-CM | POA: Diagnosis not present

## 2020-10-26 NOTE — Telephone Encounter (Signed)
Spoke with Maurie Boettcher, NP at [facility] who states the pt is having increased dyspnea and increased PVC's. Sharyl Nimrod states that the pt had a chest Xray in the office and it looks bad. She is going to place the pt on 2 antibiotics, increase his metoprolol and try to get some fluid off. She is going to check labs as well and will let us know if he needs to be seen sooner.

## 2020-10-26 NOTE — Telephone Encounter (Signed)
Calling in regards to patient sob. Please advise

## 2020-10-30 DIAGNOSIS — I739 Peripheral vascular disease, unspecified: Secondary | ICD-10-CM | POA: Diagnosis not present

## 2020-10-30 DIAGNOSIS — R112 Nausea with vomiting, unspecified: Secondary | ICD-10-CM | POA: Diagnosis not present

## 2020-10-30 DIAGNOSIS — K5730 Diverticulosis of large intestine without perforation or abscess without bleeding: Secondary | ICD-10-CM | POA: Diagnosis not present

## 2020-10-30 DIAGNOSIS — I959 Hypotension, unspecified: Secondary | ICD-10-CM | POA: Diagnosis not present

## 2020-10-30 DIAGNOSIS — R911 Solitary pulmonary nodule: Secondary | ICD-10-CM | POA: Diagnosis not present

## 2020-10-30 DIAGNOSIS — N323 Diverticulum of bladder: Secondary | ICD-10-CM | POA: Diagnosis not present

## 2020-10-30 DIAGNOSIS — E871 Hypo-osmolality and hyponatremia: Secondary | ICD-10-CM | POA: Diagnosis not present

## 2020-10-30 DIAGNOSIS — I110 Hypertensive heart disease with heart failure: Secondary | ICD-10-CM | POA: Diagnosis not present

## 2020-10-30 DIAGNOSIS — I255 Ischemic cardiomyopathy: Secondary | ICD-10-CM | POA: Diagnosis not present

## 2020-10-30 DIAGNOSIS — R531 Weakness: Secondary | ICD-10-CM | POA: Diagnosis not present

## 2020-10-30 DIAGNOSIS — I493 Ventricular premature depolarization: Secondary | ICD-10-CM | POA: Diagnosis not present

## 2020-10-30 DIAGNOSIS — Z7902 Long term (current) use of antithrombotics/antiplatelets: Secondary | ICD-10-CM | POA: Diagnosis not present

## 2020-10-30 DIAGNOSIS — Z87891 Personal history of nicotine dependence: Secondary | ICD-10-CM | POA: Diagnosis not present

## 2020-10-30 DIAGNOSIS — E872 Acidosis: Secondary | ICD-10-CM | POA: Diagnosis not present

## 2020-10-30 DIAGNOSIS — E785 Hyperlipidemia, unspecified: Secondary | ICD-10-CM | POA: Diagnosis not present

## 2020-10-30 DIAGNOSIS — R55 Syncope and collapse: Secondary | ICD-10-CM | POA: Diagnosis not present

## 2020-10-30 DIAGNOSIS — I5022 Chronic systolic (congestive) heart failure: Secondary | ICD-10-CM | POA: Diagnosis not present

## 2020-10-30 DIAGNOSIS — K5732 Diverticulitis of large intestine without perforation or abscess without bleeding: Secondary | ICD-10-CM | POA: Diagnosis not present

## 2020-10-30 DIAGNOSIS — I25118 Atherosclerotic heart disease of native coronary artery with other forms of angina pectoris: Secondary | ICD-10-CM | POA: Diagnosis not present

## 2020-10-30 DIAGNOSIS — J449 Chronic obstructive pulmonary disease, unspecified: Secondary | ICD-10-CM | POA: Diagnosis not present

## 2020-10-30 DIAGNOSIS — R001 Bradycardia, unspecified: Secondary | ICD-10-CM | POA: Diagnosis not present

## 2020-10-30 DIAGNOSIS — R109 Unspecified abdominal pain: Secondary | ICD-10-CM | POA: Diagnosis not present

## 2020-10-30 DIAGNOSIS — E860 Dehydration: Secondary | ICD-10-CM | POA: Diagnosis not present

## 2020-10-30 DIAGNOSIS — I471 Supraventricular tachycardia: Secondary | ICD-10-CM | POA: Diagnosis not present

## 2020-10-30 DIAGNOSIS — R197 Diarrhea, unspecified: Secondary | ICD-10-CM | POA: Diagnosis not present

## 2020-10-31 DIAGNOSIS — R55 Syncope and collapse: Secondary | ICD-10-CM | POA: Diagnosis not present

## 2020-10-31 DIAGNOSIS — I5022 Chronic systolic (congestive) heart failure: Secondary | ICD-10-CM | POA: Diagnosis not present

## 2020-10-31 DIAGNOSIS — I2510 Atherosclerotic heart disease of native coronary artery without angina pectoris: Secondary | ICD-10-CM | POA: Diagnosis not present

## 2020-10-31 DIAGNOSIS — R531 Weakness: Secondary | ICD-10-CM | POA: Diagnosis not present

## 2020-10-31 DIAGNOSIS — I429 Cardiomyopathy, unspecified: Secondary | ICD-10-CM | POA: Diagnosis not present

## 2020-10-31 DIAGNOSIS — I739 Peripheral vascular disease, unspecified: Secondary | ICD-10-CM | POA: Diagnosis not present

## 2020-10-31 DIAGNOSIS — I352 Nonrheumatic aortic (valve) stenosis with insufficiency: Secondary | ICD-10-CM | POA: Diagnosis not present

## 2020-10-31 DIAGNOSIS — I493 Ventricular premature depolarization: Secondary | ICD-10-CM | POA: Diagnosis not present

## 2020-10-31 DIAGNOSIS — I471 Supraventricular tachycardia: Secondary | ICD-10-CM | POA: Diagnosis not present

## 2020-10-31 DIAGNOSIS — K5732 Diverticulitis of large intestine without perforation or abscess without bleeding: Secondary | ICD-10-CM | POA: Diagnosis not present

## 2020-11-01 DIAGNOSIS — R531 Weakness: Secondary | ICD-10-CM | POA: Diagnosis not present

## 2020-11-01 DIAGNOSIS — K5732 Diverticulitis of large intestine without perforation or abscess without bleeding: Secondary | ICD-10-CM | POA: Diagnosis not present

## 2020-11-01 DIAGNOSIS — I429 Cardiomyopathy, unspecified: Secondary | ICD-10-CM | POA: Diagnosis not present

## 2020-11-01 DIAGNOSIS — R55 Syncope and collapse: Secondary | ICD-10-CM | POA: Diagnosis not present

## 2020-11-01 DIAGNOSIS — I2510 Atherosclerotic heart disease of native coronary artery without angina pectoris: Secondary | ICD-10-CM | POA: Diagnosis not present

## 2020-11-01 DIAGNOSIS — I5022 Chronic systolic (congestive) heart failure: Secondary | ICD-10-CM | POA: Diagnosis not present

## 2020-11-03 ENCOUNTER — Telehealth: Payer: Self-pay | Admitting: Cardiology

## 2020-11-03 DIAGNOSIS — I5020 Unspecified systolic (congestive) heart failure: Secondary | ICD-10-CM | POA: Diagnosis not present

## 2020-11-03 DIAGNOSIS — R001 Bradycardia, unspecified: Secondary | ICD-10-CM | POA: Diagnosis not present

## 2020-11-03 DIAGNOSIS — R0600 Dyspnea, unspecified: Secondary | ICD-10-CM | POA: Diagnosis not present

## 2020-11-03 NOTE — Telephone Encounter (Signed)
[name] is calling requesting to speak with Derek [name] in regards to the EKG changes they found today as well as possible getting the patient worked in for an appt. States she goes to lunch from 12-1 requesting a callback after 1. Please advise.

## 2020-11-03 NOTE — Telephone Encounter (Signed)
Spoke to [place] just now   Follow up post hospitalization. The EKG today showed some changes that they were concerned about. They are faxing over the old EKG that they have and the new one that they are comparing it to today along with the office visit noted. They would like to know if Dr. Dulce Sellar has any recommendations as far as medication changes that they should address.   The hospital discontinued the patients entresto and metoprolol and started him on valsartan.

## 2020-11-12 DIAGNOSIS — R0600 Dyspnea, unspecified: Secondary | ICD-10-CM | POA: Diagnosis not present

## 2020-11-12 DIAGNOSIS — R739 Hyperglycemia, unspecified: Secondary | ICD-10-CM | POA: Diagnosis not present

## 2020-11-12 DIAGNOSIS — I959 Hypotension, unspecified: Secondary | ICD-10-CM | POA: Diagnosis not present

## 2020-11-12 DIAGNOSIS — R001 Bradycardia, unspecified: Secondary | ICD-10-CM | POA: Diagnosis not present

## 2020-11-12 DIAGNOSIS — D649 Anemia, unspecified: Secondary | ICD-10-CM | POA: Diagnosis not present

## 2020-11-18 ENCOUNTER — Other Ambulatory Visit: Payer: Self-pay

## 2020-11-18 ENCOUNTER — Ambulatory Visit: Payer: Medicare Other | Admitting: Cardiology

## 2020-11-18 VITALS — BP 100/58 | HR 94 | Ht 70.5 in | Wt 193.0 lb

## 2020-11-18 DIAGNOSIS — I350 Nonrheumatic aortic (valve) stenosis: Secondary | ICD-10-CM | POA: Diagnosis not present

## 2020-11-18 DIAGNOSIS — J449 Chronic obstructive pulmonary disease, unspecified: Secondary | ICD-10-CM | POA: Diagnosis not present

## 2020-11-18 DIAGNOSIS — R059 Cough, unspecified: Secondary | ICD-10-CM | POA: Diagnosis not present

## 2020-11-18 DIAGNOSIS — R0600 Dyspnea, unspecified: Secondary | ICD-10-CM

## 2020-11-18 DIAGNOSIS — I5022 Chronic systolic (congestive) heart failure: Secondary | ICD-10-CM

## 2020-11-18 DIAGNOSIS — R0609 Other forms of dyspnea: Secondary | ICD-10-CM

## 2020-11-18 DIAGNOSIS — I255 Ischemic cardiomyopathy: Secondary | ICD-10-CM

## 2020-11-18 DIAGNOSIS — R0602 Shortness of breath: Secondary | ICD-10-CM

## 2020-11-18 DIAGNOSIS — Z0000 Encounter for general adult medical examination without abnormal findings: Secondary | ICD-10-CM | POA: Diagnosis not present

## 2020-11-18 DIAGNOSIS — I429 Cardiomyopathy, unspecified: Secondary | ICD-10-CM

## 2020-11-18 HISTORY — DX: Cardiomyopathy, unspecified: I42.9

## 2020-11-18 NOTE — Progress Notes (Signed)
Cardiology Office Note:    Date:  11/18/2020   ID:  Derek [name], DOB 10/30/1940, MRN [id number]  PCP:  Ninfa Meeker, FNP  Cardiologist:  Gypsy Balsam, MD    Referring MD: Vertell Novak*   Chief Complaint  Patient presents with   Shortness of Breath   Fatigue    History of Present Illness:    Derek [name] is a 80 y.o. male gentleman with incredibly complex past medical history.  He does have coronary artery disease.  On 04 September 2020 he did have a cardiac catheterization done which showed mild aortic stenosis with mean gradient 15 mmHg, normal LVEDP and filling pressures, single-vessel CAD with occlusion of the mid left circumflex artery.  Cardiac wise he also gets frequent PVCs as well as PACs and short runs of supraventricular tachycardia.  He also have cardiomyopathy with ejection fraction 30 to 35% based on echocardiogram from December 2021.  Recently he ended up being in the hospital because of hypotension and shortness of breath and episode of syncope.  His medication has been adjusted meaning dosages of medication has been reduced she is being discharged home.  He comes today and I have never seen him before he with chief complaint of fatigue tiredness and shortness of breath.  Denies have any chest pain tightness squeezing pressure burning chest no passing out.  He does get dizzy when he gets up very quickly  Past Medical History:  Diagnosis Date   Androgen deficiency    Arthritis    Atherosclerosis of native arteries of the extremities with intermittent claudication 02/04/2014   B12 deficiency    Claudication [place])    COPD (chronic obstructive pulmonary disease) (HCC)    COPD GOLD II     Pfts 07/15/13:  FeV1 1.46 (56%)  59% ratio 68   RV/TLC 145%  dlco  59%> 66%     Degenerative arthritis    Essential hypertension    Hypertension    IBS (irritable bowel syndrome)    Obese    PAD (peripheral artery disease) (HCC)    Pulmonary nodule    Pure  hypercholesterolemia    Ulcerative colitis (HCC)     Past Surgical History:  Procedure Laterality Date   ABDOMINAL AORTOGRAM W/LOWER EXTREMITY N/A 10/30/2017   Procedure: ABDOMINAL AORTOGRAM W/LOWER EXTREMITY;  Surgeon: Maeola Harman, MD;  Location: [facility] INVASIVE CV LAB;  Service: Cardiovascular;  Laterality: N/A;   AORTA - ILIAC ARTERY BYPASS GRAFT     PERIPHERAL VASCULAR INTERVENTION  10/30/2017   Procedure: PERIPHERAL VASCULAR INTERVENTION;  Surgeon: Maeola Harman, MD;  Location: [facility] INVASIVE CV LAB;  Service: Cardiovascular;;  LT Iliac   RIGHT/LEFT HEART CATH AND CORONARY ANGIOGRAPHY N/A 09/04/2020   Procedure: RIGHT/LEFT HEART CATH AND CORONARY ANGIOGRAPHY;  Surgeon: Tonny Bollman, MD;  Location: [facility] INVASIVE CV LAB;  Service: Cardiovascular;  Laterality: N/A;    Current Medications: Current Meds  Medication Sig   atorvastatin (LIPITOR) 10 MG tablet Take 10 mg by mouth at bedtime.   clopidogrel (PLAVIX) 75 MG tablet Take 75 mg by mouth daily.   ferrous sulfate 325 (65 FE) MG tablet Take 325 mg by mouth daily with breakfast.   mesalamine (LIALDA) 1.2 g EC tablet Take 4.8 g by mouth every morning.   sacubitril-valsartan (ENTRESTO) 49-51 MG Take 1 tablet by mouth 2 (two) times daily.   valsartan (DIOVAN) 40 MG tablet Take 40 mg by mouth 2 (two) times daily.   WIXELA INHUB 100-50  MCG/DOSE AEPB Inhale 1 puff into the lungs 2 (two) times daily.   Zinc Oxide 10 % OINT Apply 1 application topically as needed (burning).     Allergies:   Aleve [naproxen sodium]   Social History   Socioeconomic History   Marital status: Married    Spouse name: Not on file   Number of children: Not on file   Years of education: Not on file   Highest education level: Not on file  Occupational History   Occupation: Retired  Tobacco Use   Smoking status: Former    Packs/day: 2.50    Years: 46.00    Pack years: 115.00    Types: Cigarettes    Quit date: 05/23/1996    Years since  quitting: 24.5   Smokeless tobacco: Never  Vaping Use   Vaping Use: Never used  Substance and Sexual Activity   Alcohol use: Yes    Alcohol/week: 3.0 standard drinks    Types: 3 Cans of beer per week   Drug use: No   Sexual activity: Not on file  Other Topics Concern   Not on file  Social History Narrative   Not on file   Social Determinants of Health   Financial Resource Strain: Not on file  Food Insecurity: Not on file  Transportation Needs: Not on file  Physical Activity: Not on file  Stress: Not on file  Social Connections: Not on file     Family History: The patient's family history includes Alzheimer's disease in his father; Dementia in his mother. ROS:   Please see the history of present illness.    All 14 point review of systems negative except as described per history of present illness  EKGs/Labs/Other Studies Reviewed:      Recent Labs: 07/03/2020: BUN 11; Creatinine, Ser 0.86; NT-Pro BNP 2,226 09/04/2020: Hemoglobin 12.6; Hemoglobin 12.9; Potassium 3.5; Potassium 3.5; Sodium 140; Sodium 140  Recent Lipid Panel No results found for: CHOL, TRIG, HDL, CHOLHDL, VLDL, LDLCALC, LDLDIRECT  Physical Exam:    VS:  BP (!) 100/58 (BP Location: Right Arm, Patient Position: Sitting)   Pulse 94   Ht 5' 10.5" (1.791 m)   Wt 193 lb (87.5 kg)   SpO2 91%   BMI 27.30 kg/m     Wt Readings from Last 3 Encounters:  11/18/20 193 lb (87.5 kg)  09/21/20 193 lb 6.4 oz (87.7 kg)  09/04/20 185 lb (83.9 kg)     GEN:  Well nourished, well developed in no acute distress HEENT: Normal NECK: No JVD; No carotid bruits LYMPHATICS: No lymphadenopathy CARDIAC: RRR, systolic ejection murmur grade 2 through 3/6 percent right upper portion of the sternum, S2 is soft but still present, no rubs, no gallops RESPIRATORY: Bilateral rhonchi ABDOMEN: Soft, non-tender, non-distended MUSCULOSKELETAL:  No edema; No deformity  SKIN: Warm and dry LOWER EXTREMITIES: no swelling NEUROLOGIC:   Alert and oriented x 3 PSYCHIATRIC:  Normal affect   ASSESSMENT:    1. Mild aortic stenosis   2. Chronic systolic heart failure (HCC)   3. Dyspnea on exertion   4. Ischemic cardiomyopathy   5. COPD GOLD II    6. SOB (shortness of breath)    PLAN:    In order of problems listed above:  Fatigue tiredness shortness of breath I do not think it is mostly related to his heart he does have significant COPD my opinion in my opinion he can benefit from seeing pulmonary doctor.  He does have mild aortic stenosis that does  not explain his symptomatology.  On the physical exam I do not see decompensation of congestive heart failure.  However, I will check his proBNP today to see if there is any room for augmentation of his medication for CHF. Mild aortic stenosis does not require any intervention right now it does not explain his symptomatology Dyspnea on exertion mild.  Mostly related to his pulmonary pressure but again I will check proBNP today make sure I am not missing any subclinical congestive heart failure that contribute to his symptomatology History of syncope.  Denies having any. Overall is a very complex situation will refer him to pulmonary.  I did review record from the hospital for this visit   Medication Adjustments/Labs and Tests Ordered: Current medicines are reviewed at length with the patient today.  Concerns regarding medicines are outlined above.  Orders Placed This Encounter  Procedures   Basic metabolic panel   Pro b natriuretic peptide (BNP)   Ambulatory referral to Pulmonology   EKG 12-Lead   Medication changes: No orders of the defined types were placed in this encounter.   Signed, Georgeanna Lea, MD, [facility] 11/18/2020 3:05 PM    [facility]

## 2020-11-18 NOTE — Patient Instructions (Signed)
Medication Instructions:  Your physician recommends that you continue on your current medications as directed. Please refer to the Current Medication list given to you today.  *If you need a refill on your cardiac medications before your next appointment, please call your pharmacy*   Lab Work: Your physician recommends that you return for lab work today: bmp, pro bnp   If you have labs (blood work) drawn today and your tests are completely normal, you will receive your results only by: . MyChart Message (if you have MyChart) OR . A paper copy in the mail If you have any lab test that is abnormal or we need to change your treatment, we will call you to review the results.   Testing/Procedures: None.    Follow-Up: At [facility], you and your health needs are our priority.  As part of our continuing mission to provide you with exceptional heart care, we have created designated Provider Care Teams.  These Care Teams include your primary Cardiologist (physician) and Advanced Practice Providers (APPs -  Physician Assistants and Nurse Practitioners) who all work together to provide you with the care you need, when you need it.  We recommend signing up for the patient portal called "MyChart".  Sign up information is provided on this After Visit Summary.  MyChart is used to connect with patients for Virtual Visits (Telemedicine).  Patients are able to view lab/test results, encounter notes, upcoming appointments, etc.  Non-urgent messages can be sent to your provider as well.   To learn more about what you can do with MyChart, go to [email or web address].    Your next appointment:   2 month(s)  The format for your next appointment:   In Person  Provider:   Robert Krasowski, MD   Other Instructions    

## 2020-11-19 LAB — BASIC METABOLIC PANEL
BUN/Creatinine Ratio: 12 (ref 10–24)
BUN: 12 mg/dL (ref 8–27)
CO2: 24 mmol/L (ref 20–29)
Calcium: 9.2 mg/dL (ref 8.6–10.2)
Chloride: 103 mmol/L (ref 96–106)
Creatinine, Ser: 0.97 mg/dL (ref 0.76–1.27)
Glucose: 105 mg/dL — ABNORMAL HIGH (ref 65–99)
Potassium: 4.2 mmol/L (ref 3.5–5.2)
Sodium: 142 mmol/L (ref 134–144)
eGFR: 78 mL/min/{1.73_m2} (ref >59)

## 2020-11-19 LAB — PRO B NATRIURETIC PEPTIDE: NT-Pro BNP: 4417 pg/mL — ABNORMAL HIGH (ref 0–486)

## 2020-11-24 ENCOUNTER — Telehealth: Payer: Self-pay

## 2020-11-24 DIAGNOSIS — I10 Essential (primary) hypertension: Secondary | ICD-10-CM

## 2020-11-24 MED ORDER — FUROSEMIDE 20 MG PO TABS: 20.0000 mg | ORAL_TABLET | Freq: Every day | ORAL | 3 refills | Status: DC

## 2020-11-24 NOTE — Telephone Encounter (Signed)
Spoke with patient regarding results and recommendation.  Patient verbalizes understanding and is agreeable to plan of care. Advised patient to call back with any issues or concerns.  

## 2020-11-24 NOTE — Telephone Encounter (Signed)
-----   Message from Georgeanna Lea, MD sent at 11/20/2020  1:47 PM EDT ----- There is evidence of decompensated congestive heart failure.  Please 20 mg of furosemide daily, kidney function was normal.  He need to have Chem-7 done within the next week

## 2020-11-25 DIAGNOSIS — R9430 Abnormal result of cardiovascular function study, unspecified: Secondary | ICD-10-CM | POA: Diagnosis not present

## 2020-11-25 DIAGNOSIS — R0609 Other forms of dyspnea: Secondary | ICD-10-CM | POA: Diagnosis not present

## 2020-11-25 DIAGNOSIS — J449 Chronic obstructive pulmonary disease, unspecified: Secondary | ICD-10-CM | POA: Diagnosis not present

## 2020-11-25 DIAGNOSIS — M3320 Polymyositis, organ involvement unspecified: Secondary | ICD-10-CM | POA: Diagnosis not present

## 2020-12-01 ENCOUNTER — Other Ambulatory Visit: Payer: Self-pay

## 2020-12-01 ENCOUNTER — Ambulatory Visit: Payer: Medicare Other | Admitting: Cardiology

## 2020-12-01 DIAGNOSIS — I10 Essential (primary) hypertension: Secondary | ICD-10-CM

## 2020-12-02 DIAGNOSIS — J4530 Mild persistent asthma, uncomplicated: Secondary | ICD-10-CM | POA: Diagnosis not present

## 2020-12-02 DIAGNOSIS — R911 Solitary pulmonary nodule: Secondary | ICD-10-CM | POA: Diagnosis not present

## 2020-12-02 LAB — BASIC METABOLIC PANEL
BUN/Creatinine Ratio: 13 (ref 10–24)
BUN: 9 mg/dL (ref 8–27)
CO2: 24 mmol/L (ref 20–29)
Calcium: 9.1 mg/dL (ref 8.6–10.2)
Chloride: 103 mmol/L (ref 96–106)
Creatinine, Ser: 0.67 mg/dL — ABNORMAL LOW (ref 0.76–1.27)
Glucose: 96 mg/dL (ref 65–99)
Potassium: 4.2 mmol/L (ref 3.5–5.2)
Sodium: 142 mmol/L (ref 134–144)
eGFR: 93 mL/min/{1.73_m2} (ref >59)

## 2020-12-04 DIAGNOSIS — I739 Peripheral vascular disease, unspecified: Secondary | ICD-10-CM | POA: Diagnosis not present

## 2020-12-04 DIAGNOSIS — M2550 Pain in unspecified joint: Secondary | ICD-10-CM | POA: Diagnosis not present

## 2020-12-07 ENCOUNTER — Institutional Professional Consult (permissible substitution): Payer: Medicare Other | Admitting: Internal Medicine

## 2020-12-15 DIAGNOSIS — J90 Pleural effusion, not elsewhere classified: Secondary | ICD-10-CM | POA: Diagnosis not present

## 2020-12-15 DIAGNOSIS — I700 Atherosclerosis of aorta: Secondary | ICD-10-CM | POA: Diagnosis not present

## 2020-12-15 DIAGNOSIS — J439 Emphysema, unspecified: Secondary | ICD-10-CM | POA: Diagnosis not present

## 2020-12-15 DIAGNOSIS — R911 Solitary pulmonary nodule: Secondary | ICD-10-CM | POA: Diagnosis not present

## 2020-12-21 DIAGNOSIS — Z79899 Other long term (current) drug therapy: Secondary | ICD-10-CM | POA: Diagnosis not present

## 2020-12-21 DIAGNOSIS — E538 Deficiency of other specified B group vitamins: Secondary | ICD-10-CM | POA: Diagnosis not present

## 2020-12-21 DIAGNOSIS — M3320 Polymyositis, organ involvement unspecified: Secondary | ICD-10-CM | POA: Diagnosis not present

## 2020-12-21 DIAGNOSIS — R9430 Abnormal result of cardiovascular function study, unspecified: Secondary | ICD-10-CM | POA: Diagnosis not present

## 2020-12-21 DIAGNOSIS — I10 Essential (primary) hypertension: Secondary | ICD-10-CM | POA: Diagnosis not present

## 2020-12-21 DIAGNOSIS — R0602 Shortness of breath: Secondary | ICD-10-CM | POA: Diagnosis not present

## 2020-12-21 DIAGNOSIS — J449 Chronic obstructive pulmonary disease, unspecified: Secondary | ICD-10-CM | POA: Diagnosis not present

## 2020-12-21 DIAGNOSIS — I498 Other specified cardiac arrhythmias: Secondary | ICD-10-CM | POA: Diagnosis not present

## 2020-12-21 DIAGNOSIS — I517 Cardiomegaly: Secondary | ICD-10-CM | POA: Diagnosis not present

## 2020-12-22 ENCOUNTER — Telehealth: Payer: Self-pay | Admitting: Cardiology

## 2020-12-22 NOTE — Telephone Encounter (Signed)
Spoke with [facility] she is made aware of the diagnoses we have on file for the patient. No further questions at this time.

## 2020-12-22 NOTE — Telephone Encounter (Signed)
    Brook with [facility] calling would like to confirm if pt diagnosed for abnormal rhythm or sick sinus syndrome

## 2020-12-22 NOTE — Telephone Encounter (Signed)
Called back, no answer at this time. Left message for her to return the call.

## 2020-12-23 ENCOUNTER — Telehealth: Payer: Self-pay | Admitting: Cardiology

## 2020-12-23 ENCOUNTER — Ambulatory Visit: Payer: Medicare Other | Admitting: Cardiology

## 2020-12-23 DIAGNOSIS — I5022 Chronic systolic (congestive) heart failure: Secondary | ICD-10-CM

## 2020-12-23 NOTE — Addendum Note (Signed)
Addended by: Delorse Limber I on: 12/23/2020 12:51 PM   Modules accepted: Orders

## 2020-12-23 NOTE — Telephone Encounter (Signed)
Left another message for [name] to return the call.

## 2020-12-23 NOTE — Telephone Encounter (Signed)
Called and left a message for [name] at this time letting her know that I have placed the referral for the heart failure clinic and to have the patient keep his scheduled appointment with Dr. Dulce Sellar as well. I gave our call back number for her to call back with any other questions or concerns.

## 2020-12-23 NOTE — Telephone Encounter (Signed)
Left message for Derek [name] to return the call.

## 2020-12-23 NOTE — Telephone Encounter (Signed)
Calling asking to speak with the nurse. Please advise

## 2020-12-23 NOTE — Telephone Encounter (Signed)
Spoke with Derek [name] from [place]. She states she normally talks to Derek [name] about this patient. Per Derek [name] his PCP. His BNP has went from 988 to 1885 in one month. Patient is experiencing non-pitting edema and fatigue. The patient takes Furosemide 20 mg once daily. Derek [name] thinks he could benefit from Heart Failure clinic but wants to know Derek [name] thoughts about caring for this patient. Derek [name] would like a care back for continuity of care. Will froward to Derek [name] and his nurse.

## 2020-12-26 DIAGNOSIS — R0609 Other forms of dyspnea: Secondary | ICD-10-CM | POA: Diagnosis not present

## 2020-12-30 DIAGNOSIS — J4530 Mild persistent asthma, uncomplicated: Secondary | ICD-10-CM | POA: Diagnosis not present

## 2020-12-30 DIAGNOSIS — R911 Solitary pulmonary nodule: Secondary | ICD-10-CM | POA: Diagnosis not present

## 2021-01-17 NOTE — Progress Notes (Signed)
Cardiology Office Note:    Date:  01/18/2021   ID:  Derek [name], DOB 1940/09/12, MRN [id number]  PCP:  Ninfa Meeker, FNP  Cardiologist:  Norman Herrlich, MD    Referring MD: Vertell Novak*    ASSESSMENT:    1. Chronic systolic heart failure (HCC)   2. Coronary artery disease of native artery of native heart with stable angina pectoris (HCC)   3. Mild aortic stenosis   4. PAD (peripheral artery disease) (HCC)   5. PVC (premature ventricular contraction)   6. SOB (shortness of breath)    PLAN:    In order of problems listed above:  Is a very complex case with multiple etiologies of shortness of breath including his cardiomyopathy CAD and underlying lung disease.  My opinion is improved from last time seen him he has no volume overload he is better with steroids and bronchodilators but still has fatigue and exercise intolerance.  Unfortunately guideline directed therapy with Entresto, beta-blocker was not tolerated with syncope and near syncope.  We will continue his low-dose loop diuretic low-dose ARB initiate SGLT2 inhibitor and vericiquat for systolic heart failure.  Also refer to EP for consideration of device therapy he is concerned about his longevity. Stable CAD continue medical therapy clopidogrel statin Continue lipid-lowering a high intensity statin   Next appointment: 2 months with me   Medication Adjustments/Labs and Tests Ordered: Current medicines are reviewed at length with the patient today.  Concerns regarding medicines are outlined above.  No orders of the defined types were placed in this encounter.  No orders of the defined types were placed in this encounter.   Chief Complaint  Patient presents with   Follow-up   Congestive Heart Failure   Coronary Artery Disease   Aortic Stenosis    History of Present Illness:    Derek [name] is a 80 y.o. male with a hx of CAD with chronic occlusion of mid left circumflex coronary artery mild  aortic stenosis frequent PVCs frequent APCs short episodes of SVT and cardiomyopathy EF of 30 to 35% as well as hypertension and syncope last seen by Dr. Bing Matter 11/18/2020.  He also has significant COPD.  Episodes proBNP level was severely elevated.  Is a history of PAD with remote left femoral-popliteal bypass with vein graft more than 20 years ago and subsequent PCI and stent left common femoral and external iliac artery June 2019 moderately diminished ABI bilaterally and pulmonary nodules.  Compliance with diet, lifestyle and medications: Yes  He has seen multiple cardiologist since last seen by me) extensive review of his records for today's visit noted below  He has been treated with steroids bronchodilators he is improved he was able to golf the other day but he still fatigues easily and has exertional shortness of breath more than mild activities  He is on a diuretic he has no edema orthopnea chest pain syncope or palpitation he has been intolerant of beta-blocker bradycardia and Entresto with symptomatic hypotension.  He tolerates low-dose valsartan well.    He is concerned about his longevity and chances of dying from heart disease  Optimize treatment for heart failure we will start SGLT2 inhibitor and low-dose Vericiquat follow-up BMP proBNP in 2 weeks  I discussed with him ICD as an option and refer him to EP for consideration  I think he has been improved with his pulmonary therapy and should continue his bronchodilators steroids his perception is that he only has asthma and I  told him the CT scan also showed findings of chronic lung disease    Component Ref Range & Units 2 mo ago 6 mo ago 7 mo ago  NT-Pro BNP 0 - 486 pg/mL 4,417 High   2,226 High  CM  1,476 High  CM    RIGHT/LEFT HEART CATH AND CORONARY ANGIOGRAPHY 09/04/2020   Conclusion  1. Single vessel CAD with total occlusion of the mid circumflex (dominant vessel) 2. Mild aortic stenosis with mean gradient 15 mmHg  (valve easily crossed with J-wire) 3. Normal right heart pressures and normal LVEDP   Frequent PVC's and PAC's noted during the procedure   Recommend: medical therapy, consider monitor to quantify PVC burden if indicated, follow-up Dr Dulce Sellar  Echocardiogram 05/18/2020 EF 30 to 35% global hypokinesia mild LVH and grade 1 diastolic dysfunction.  Right ventricular function was normal there is mild mitral regurgitation and mild aortic stenosis.  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  His event monitor reported 10/12/2020 shows frequent PACs 10% incidence 2% incidence of couplets and triplets and there are runs of PVC longest 10.3 seconds rate of 120 bpm.  He also had frequent episodes of SVT.  He had chest CT ordered by pulmonary performed 07 26 showed stable pulmonary nodules resolution of previous bilateral pneumonia decreasing small pleural effusions and diffuse bronchial wall thickening with mild emphysema.  He was discharged from [facility] 11/01/2020 after presenting with hypotension.  It was felt to be related to medications including Toprol and increase in the dose of his Toprol-XL.  He was gently hydrated he also had sinus bradycardia.  Laboratory studies showed a hemoglobin of 12.9 creatinine 0.6 potassium 4.50 troponins were assessed they are all normal. Past Medical History:  Diagnosis Date   Androgen deficiency    Arthritis    Atherosclerosis of native arteries of the extremities with intermittent claudication 02/04/2014   B12 deficiency    Claudication (HCC)    COPD (chronic obstructive pulmonary disease) (HCC)    COPD GOLD II     Pfts 07/15/13:  FeV1 1.46 (56%)  59% ratio 68   RV/TLC 145%  dlco  59%> 66%     Degenerative arthritis    Essential hypertension    Hypertension    IBS (irritable bowel syndrome)    Obese    PAD (peripheral artery disease) (HCC)    Pulmonary nodule    Pure hypercholesterolemia    Ulcerative  colitis (HCC)     Past Surgical History:  Procedure Laterality Date   ABDOMINAL AORTOGRAM W/LOWER EXTREMITY N/A 10/30/2017   Procedure: ABDOMINAL AORTOGRAM W/LOWER EXTREMITY;  Surgeon: Maeola Harman, MD;  Location: [facility] INVASIVE CV LAB;  Service: Cardiovascular;  Laterality: N/A;   AORTA - ILIAC ARTERY BYPASS GRAFT     PERIPHERAL VASCULAR INTERVENTION  10/30/2017   Procedure: PERIPHERAL VASCULAR INTERVENTION;  Surgeon: Maeola Harman, MD;  Location: [facility] INVASIVE CV LAB;  Service: Cardiovascular;;  LT Iliac   RIGHT/LEFT HEART CATH AND CORONARY ANGIOGRAPHY N/A 09/04/2020   Procedure: RIGHT/LEFT HEART CATH AND CORONARY ANGIOGRAPHY;  Surgeon: Tonny Bollman, MD;  Location: [facility] INVASIVE CV LAB;  Service: Cardiovascular;  Laterality: N/A;    Current Medications: Current Meds  Medication Sig   Ascorbic Acid (VITAMIN C) 1000 MG tablet Take 1,000 mg by mouth daily.   atorvastatin (LIPITOR) 10 MG tablet Take 10 mg by mouth at bedtime.   BREZTRI AEROSPHERE 160-9-4.8 MCG/ACT AERO Inhale 1 puff into  the lungs in the morning and at bedtime.   clopidogrel (PLAVIX) 75 MG tablet Take 75 mg by mouth daily.   Cyanocobalamin (B-12) 1000 MCG TABS Take 1 tablet by mouth daily.   ferrous sulfate 325 (65 FE) MG tablet Take 325 mg by mouth daily with breakfast.   furosemide (LASIX) 20 MG tablet Take 1 tablet (20 mg total) by mouth daily.   mesalamine (LIALDA) 1.2 g EC tablet Take 4.8 g by mouth every morning.   PREDNISONE PO Take 10 mg by mouth daily.   valsartan (DIOVAN) 40 MG tablet Take 40 mg by mouth 2 (two) times daily.   Zinc Oxide 10 % OINT Apply 1 application topically as needed (burning).     Allergies:   Aleve [naproxen sodium], Entresto [sacubitril-valsartan], and Metoprolol   Social History   Socioeconomic History   Marital status: Married    Spouse name: Not on file   Number of children: Not on file   Years of education: Not on file   Highest education level: Not on file   Occupational History   Occupation: Retired  Tobacco Use   Smoking status: Former    Packs/day: 2.50    Years: 46.00    Pack years: 115.00    Types: Cigarettes    Quit date: 05/23/1996    Years since quitting: 24.6   Smokeless tobacco: Never  Vaping Use   Vaping Use: Never used  Substance and Sexual Activity   Alcohol use: Yes    Alcohol/week: 3.0 standard drinks    Types: 3 Cans of beer per week   Drug use: No   Sexual activity: Not on file  Other Topics Concern   Not on file  Social History Narrative   Not on file   Social Determinants of Health   Financial Resource Strain: Not on file  Food Insecurity: Not on file  Transportation Needs: Not on file  Physical Activity: Not on file  Stress: Not on file  Social Connections: Not on file     Family History: The patient's family history includes Alzheimer's disease in his father; Dementia in his mother. ROS:   Please see the history of present illness.    All other systems reviewed and are negative.  EKGs/Labs/Other Studies Reviewed:    The following studies were reviewed today:    Recent Labs: 09/04/2020: Hemoglobin 12.6; Hemoglobin 12.9 11/18/2020: NT-Pro BNP 4,417 12/01/2020: BUN 9; Creatinine, Ser 0.67; Potassium 4.2; Sodium 142  Recent Lipid Panel 09/17/2018 cholesterol 160 triglycerides 198 HDL 37  Physical Exam:    VS:  BP 126/75 (BP Location: Left Arm, Patient Position: Sitting, Cuff Size: Normal)   Pulse 68   Ht 5' 10.5" (1.791 m)   Wt 195 lb (88.5 kg)   SpO2 96%   BMI 27.58 kg/m     Wt Readings from Last 3 Encounters:  01/18/21 195 lb (88.5 kg)  11/18/20 193 lb (87.5 kg)  09/21/20 193 lb 6.4 oz (87.7 kg)     GEN: He seems to have a depressed mood most difficulty with hearing well nourished, well developed in no acute distress HEENT: Normal NECK: No JVD; No carotid bruits LYMPHATICS: No lymphadenopathy CARDIAC: Distant heart sounds 1/6 to 2/6 aortic outflow murmur localized aortic area S2  splits does not radiate to the carotids RRR, no rubs, gallops RESPIRATORY: Diffusely diminished breath sounds without rales, wheezing or rhonchi  ABDOMEN: Soft, non-tender, non-distended MUSCULOSKELETAL:  No edema; No deformity  SKIN: Warm and dry NEUROLOGIC:  Alert  and oriented x 3 PSYCHIATRIC:  Normal affect    Signed, Norman Herrlich, MD  01/18/2021 11:01 AM    [facility]

## 2021-01-18 ENCOUNTER — Ambulatory Visit: Payer: Medicare Other | Admitting: Cardiology

## 2021-01-18 ENCOUNTER — Encounter: Payer: Self-pay | Admitting: Cardiology

## 2021-01-18 ENCOUNTER — Other Ambulatory Visit: Payer: Self-pay

## 2021-01-18 VITALS — BP 126/75 | HR 68 | Ht 70.5 in | Wt 195.0 lb

## 2021-01-18 DIAGNOSIS — R0602 Shortness of breath: Secondary | ICD-10-CM

## 2021-01-18 DIAGNOSIS — I25118 Atherosclerotic heart disease of native coronary artery with other forms of angina pectoris: Secondary | ICD-10-CM

## 2021-01-18 DIAGNOSIS — I350 Nonrheumatic aortic (valve) stenosis: Secondary | ICD-10-CM

## 2021-01-18 DIAGNOSIS — I5022 Chronic systolic (congestive) heart failure: Secondary | ICD-10-CM | POA: Diagnosis not present

## 2021-01-18 DIAGNOSIS — I493 Ventricular premature depolarization: Secondary | ICD-10-CM

## 2021-01-18 DIAGNOSIS — I739 Peripheral vascular disease, unspecified: Secondary | ICD-10-CM

## 2021-01-18 MED ORDER — VERICIGUAT 2.5 MG PO TABS: 2.5000 mg | ORAL_TABLET | Freq: Every day | ORAL | 3 refills | Status: DC

## 2021-01-18 MED ORDER — DAPAGLIFLOZIN PROPANEDIOL 10 MG PO TABS: 10.0000 mg | ORAL_TABLET | Freq: Every day | ORAL | 3 refills | Status: AC

## 2021-01-18 NOTE — Patient Instructions (Signed)
Medication Instructions:  Your physician has recommended you make the following change in your medication:  START: Vericiguat 2.5 mg take one tablet by mouth daily.  START: Farxiga 10 mg take one tablet by mouth daily.  *If you need a refill on your cardiac medications before your next appointment, please call your pharmacy*   Lab Work: Your physician recommends that you return for lab work in: 2 weeks BMP, ProBNP If you have labs (blood work) drawn today and your tests are completely normal, you will receive your results only by: MyChart Message (if you have MyChart) OR A paper copy in the mail If you have any lab test that is abnormal or we need to change your treatment, we will call you to review the results.   Testing/Procedures: None   Follow-Up: At [facility], you and your health needs are our priority.  As part of our continuing mission to provide you with exceptional heart care, we have created designated Provider Care Teams.  These Care Teams include your primary Cardiologist (physician) and Advanced Practice Providers (APPs -  Physician Assistants and Nurse Practitioners) who all work together to provide you with the care you need, when you need it.  We recommend signing up for the patient portal called "MyChart".  Sign up information is provided on this After Visit Summary.  MyChart is used to connect with patients for Virtual Visits (Telemedicine).  Patients are able to view lab/test results, encounter notes, upcoming appointments, etc.  Non-urgent messages can be sent to your provider as well.   To learn more about what you can do with MyChart, go to [email or web address].    Your next appointment:   2 month(s)  The format for your next appointment:   In Person  Provider:   Norman Herrlich, MD   Other Instructions

## 2021-01-19 ENCOUNTER — Other Ambulatory Visit: Payer: Self-pay | Admitting: Cardiology

## 2021-01-19 ENCOUNTER — Telehealth: Payer: Self-pay | Admitting: Cardiology

## 2021-01-19 NOTE — Telephone Encounter (Signed)
Spoke to the patient just now and let him know that the Marcelline Deist will help to strengthen his heart muscle function. He states that he will go get this medication today. I also advised that we could provide him with patient assistance forms to fill out to see if we can get him help with the verciguat. He states he will come pick them up tomorrow.    Encouraged patient to call back with any questions or concerns.

## 2021-01-19 NOTE — Telephone Encounter (Signed)
Pt states the Verquvo we prescribed is $350 (not covered) by insurance and would like to know if there is a substitue. Pt also states we prescribed Farxiga and pt would like to know since he isn't diabetic why he should be taking this medication.  Best number _ [phone]  Thank you!  Domingo Dimes

## 2021-01-19 NOTE — Telephone Encounter (Signed)
Spoke to patient just now and let her know that I submitted a prior auth for this medication and it was approved. He is going to call and see if this helps with the cost any and will let us know.    Encouraged patient to call back with any questions or concerns.

## 2021-01-26 DIAGNOSIS — R0609 Other forms of dyspnea: Secondary | ICD-10-CM | POA: Diagnosis not present

## 2021-02-01 ENCOUNTER — Other Ambulatory Visit: Payer: Self-pay

## 2021-02-01 ENCOUNTER — Ambulatory Visit: Payer: Medicare Other | Admitting: Cardiology

## 2021-02-01 ENCOUNTER — Encounter: Payer: Self-pay | Admitting: Cardiology

## 2021-02-01 VITALS — BP 124/72 | HR 73 | Ht 70.5 in | Wt 197.6 lb

## 2021-02-01 DIAGNOSIS — I493 Ventricular premature depolarization: Secondary | ICD-10-CM

## 2021-02-01 MED ORDER — MEXILETINE HCL 250 MG PO CAPS: 250.0000 mg | ORAL_CAPSULE | Freq: Two times a day (BID) | ORAL | 3 refills | Status: DC

## 2021-02-01 NOTE — Patient Instructions (Addendum)
Medication Instructions:  Your physician has recommended you make the following change in your medication: START Mexiletine 250 mg twice a day  *If you need a refill on your cardiac medications before your next appointment, please call your pharmacy*   Lab Work: None ordered   Testing/Procedures: None ordered   Follow-Up: At [facility], you and your health needs are our priority.  As part of our continuing mission to provide you with exceptional heart care, we have created designated Provider Care Teams.  These Care Teams include your primary Cardiologist (physician) and Advanced Practice Providers (APPs -  Physician Assistants and Nurse Practitioners) who all work together to provide you with the care you need, when you need it.  We recommend signing up for the patient portal called "MyChart".  Sign up information is provided on this After Visit Summary.  MyChart is used to connect with patients for Virtual Visits (Telemedicine).  Patients are able to view lab/test results, encounter notes, upcoming appointments, etc.  Non-urgent messages can be sent to your provider as well.   To learn more about what you can do with MyChart, go to [email or web address].    Your next appointment:   3 month(s)  The format for your next appointment:   In Person  Provider:   Loman Brooklyn, MD    Thank you for choosing [facility]!!   Dory Horn, RN [phone]   Other Instructions  Mexiletine capsules What is this medication? MEXILETINE (mex IL e teen) is an antiarrhythmic agent. This medicine is used to treat irregular heart rhythm and can slow rapid heartbeats. It can help your heart to return to and maintain a normal rhythm. Because of the side effects caused by this medicine, it is usually used for heartbeat problems that may be life-threatening. This medicine may be used for other purposes; ask your health care provider or pharmacist if you have questions. COMMON BRAND  NAME(S): Mexitil What should I tell my care team before I take this medication? They need to know if you have any of these conditions: liver disease other heart problems previous heart attack an unusual or allergic reaction to mexiletine, other medicines, foods, dyes, or preservatives pregnant or trying to get pregnant breast-feeding How should I use this medication? Take this medicine by mouth with a glass of water. Follow the directions on the prescription label. It is recommended that you take this medicine with food or an antacid. Take your doses at regular intervals. Do not take your medicine more often than directed. Do not stop taking except on the advice of your doctor or health care professional. Talk to your pediatrician regarding the use of this medicine in children. Special care may be needed. Overdosage: If you think you have taken too much of this medicine contact a poison control center or emergency room at once. NOTE: This medicine is only for you. Do not share this medicine with others. What if I miss a dose? If you miss a dose, take it as soon as you can. If it is almost time for your next dose, take only that dose. Do not take double or extra doses. What may interact with this medication? Do not take this medicine with any of the following medications: dofetilide This medicine may also interact with the following medications: caffeine cimetidine medicines for depression, anxiety, or psychotic disturbances medicines to control heart rhythm phenobarbital phenytoin rifampin theophylline This list may not describe all possible interactions. Give your health care provider a list of  all the medicines, herbs, non-prescription drugs, or dietary supplements you use. Also tell them if you smoke, drink alcohol, or use illegal drugs. Some items may interact with your medicine. What should I watch for while using this medication? Your condition will be monitored closely when you  first begin therapy. Often, this drug is first started in a hospital or other monitored health care setting. Once you are on maintenance therapy, visit your doctor or health care provider for regular checks on your progress. Because your condition and use of this medicine carry some risk, it is a good idea to carry an identification card, necklace or bracelet with details of your condition, medications, and doctor or health care provider. You may get drowsy or dizzy. Do not drive, use machinery, or do anything that needs mental alertness until you know how this medicine affects you. Do not stand or sit up quickly, especially if you are an older patient. This reduces the risk of dizzy or fainting spells. Alcohol can make you more dizzy, increase flushing and rapid heartbeats. Avoid alcoholic drinks. This medicine may cause serious skin reactions. They can happen weeks to months after starting the medicine. Contact your health care provider right away if you notice fevers or flu-like symptoms with a rash. The rash may be red or purple and then turn into blisters or peeling of the skin. Or, you might notice a red rash with swelling of the face, lips or lymph nodes in your neck or under your arms. What side effects may I notice from receiving this medication? Side effects that you should report to your doctor or health care professional as soon as possible: allergic reactions like skin rash, itching or hives, swelling of the face, lips, or tongue breathing problems chest pain, continued irregular heartbeats rash, fever, and swollen lymph nodes redness, blistering, peeling or loosening of the skin, including inside the mouth seizures skin rash trembling, shaking unusual bleeding or bruising unusually weak or tired Side effects that usually do not require medical attention (report to your doctor or health care professional if they continue or are bothersome): blurred vision difficulty  walking heartburn nausea, vomiting nervousness numbness, or tingling in the fingers or toes This list may not describe all possible side effects. Call your doctor for medical advice about side effects. You may report side effects to FDA at 1-800-FDA-1088. Where should I keep my medication? Keep out of reach of children. Store at room temperature between 15 and 30 degrees C (59 and 86 degrees F). Throw away any unused medicine after the expiration date. NOTE: This sheet is a summary. It may not cover all possible information. If you have questions about this medicine, talk to your doctor, pharmacist, or health care provider.  2022 Elsevier/Gold Standard (2018-08-15 09:25:42)

## 2021-02-01 NOTE — Progress Notes (Signed)
Electrophysiology Office Note   Date:  02/01/2021   ID:  Derek [name], DOB 1940-10-15, MRN [id number]  PCP:  Ninfa Meeker, FNP  Cardiologist: Derek [name] Primary Electrophysiologist:  Demarie Uhlig Derek Loa, MD    Chief Complaint: PVCs   History of Present Illness: Derek [name] is a 80 y.o. male who is being seen today for the evaluation of PVCs at the request of Derek [name], Derek Oven, MD. Presenting today for electrophysiology evaluation.  He has a history of coronary artery disease with total occlusion of the circumflex, mild aortic stenosis, frequent PVCs, SVT, and an ischemic cardiomyopathy with an ejection fraction of 30 to 35%.  He also has COPD.  He also has PAD with a left femoropopliteal bypass more than 20 years ago and PCI and stenting of the left common femoral and internal iliac June 2019.  Today, he denies symptoms of palpitations, chest pain, shortness of breath, orthopnea, PND, lower extremity edema, claudication, dizziness, presyncope, syncope, bleeding, or neurologic sequela. The patient is tolerating medications without difficulties.  He he was recently started on Farxiga.  And starting Farxiga, he has felt much improved.  He says that he feels like he is able to do many more of his daily activities.  He is back to playing golf with only limitations being leg weakness.   Past Medical History:  Diagnosis Date   Androgen deficiency    Arthritis    Atherosclerosis of native arteries of the extremities with intermittent claudication 02/04/2014   B12 deficiency    Claudication [facility])    COPD (chronic obstructive pulmonary disease) (HCC)    COPD GOLD II     Pfts 07/15/13:  FeV1 1.46 (56%)  59% ratio 68   RV/TLC 145%  dlco  59%> 66%     Degenerative arthritis    Essential hypertension    Hypertension    IBS (irritable bowel syndrome)    Obese    PAD (peripheral artery disease) (HCC)    Pulmonary nodule    Pure hypercholesterolemia    Ulcerative colitis (HCC)    Past  Surgical History:  Procedure Laterality Date   ABDOMINAL AORTOGRAM W/LOWER EXTREMITY N/A 10/30/2017   Procedure: ABDOMINAL AORTOGRAM W/LOWER EXTREMITY;  Surgeon: Maeola Harman, MD;  Location[facility] INVASIVE CV LAB;  Service: Cardiovascular;  Laterality: N/A;   AORTA - ILIAC ARTERY BYPASS GRAFT     PERIPHERAL VASCULAR INTERVENTION  10/30/2017   Procedure: PERIPHERAL VASCULAR INTERVENTION;  Surgeon: Maeola Harman, MD;  Location: [facility] INVASIVE CV LAB;  Service: Cardiovascular;;  LT Iliac   RIGHT/LEFT HEART CATH AND CORONARY ANGIOGRAPHY N/A 09/04/2020   Procedure: RIGHT/LEFT HEART CATH AND CORONARY ANGIOGRAPHY;  Surgeon: Tonny Bollman, MD;  Location: [facility] INVASIVE CV LAB;  Service: Cardiovascular;  Laterality: N/A;     Current Outpatient Medications  Medication Sig Dispense Refill   Ascorbic Acid (VITAMIN C) 1000 MG tablet Take 1,000 mg by mouth daily.     atorvastatin (LIPITOR) 10 MG tablet Take 10 mg by mouth at bedtime.  5   BREZTRI AEROSPHERE 160-9-4.8 MCG/ACT AERO Inhale 1 puff into the lungs in the morning and at bedtime.     clopidogrel (PLAVIX) 75 MG tablet Take 75 mg by mouth daily.     Cyanocobalamin (B-12) 1000 MCG TABS Take 1 tablet by mouth daily.     dapagliflozin propanediol (FARXIGA) 10 MG TABS tablet Take 1 tablet (10 mg total) by mouth daily before breakfast. 90 tablet 3   ferrous sulfate 325 (65  FE) MG tablet Take 325 mg by mouth daily with breakfast.     furosemide (LASIX) 20 MG tablet Take 1 tablet (20 mg total) by mouth daily. 90 tablet 3   mesalamine (LIALDA) 1.2 g EC tablet Take 4.8 g by mouth every morning.     mexiletine (MEXITIL) 250 MG capsule Take 1 capsule (250 mg total) by mouth 2 (two) times daily. 60 capsule 3   PREDNISONE PO Take 10 mg by mouth daily.     valsartan (DIOVAN) 40 MG tablet Take 40 mg by mouth 2 (two) times daily.     VERQUVO 2.5 MG TABS TAKE 2.5 MG BY MOUTH DAILY. 90 tablet 3   Zinc Oxide 10 % OINT Apply 1 application topically as  needed (burning).     No current facility-administered medications for this visit.    Allergies:   Aleve [naproxen sodium], Entresto [sacubitril-valsartan], and Metoprolol   Social History:  The patient  reports that he quit smoking about 24 years ago. His smoking use included cigarettes. He has a 115.00 pack-year smoking history. He has never used smokeless tobacco. He reports current alcohol use of about 3.0 standard drinks per week. He reports that he does not use drugs.   Family History:  The patient's family history includes Alzheimer's disease in his father; Dementia in his mother.    ROS:  Please see the history of present illness.   Otherwise, review of systems is positive for none.   All other systems are reviewed and negative.    PHYSICAL EXAM: VS:  BP 124/72   Pulse 73   Ht 5' 10.5" (1.791 m)   Wt 197 lb 9.6 oz (89.6 kg)   SpO2 95%   BMI 27.95 kg/m  , BMI Body mass index is 27.95 kg/m. GEN: Well nourished, well developed, in no acute distress  HEENT: normal  Neck: no JVD, carotid bruits, or masses Cardiac: RRR; no murmurs, rubs, or gallops,no edema  Respiratory:  clear to auscultation bilaterally, normal work of breathing GI: soft, nontender, nondistended, + BS MS: no deformity or atrophy  Skin: warm and dry Neuro:  Strength and sensation are intact Psych: euthymic mood, full affect  EKG:  EKG is ordered today. Personal review of the ekg ordered  shows sinus rhythm, PACs  Recent Labs: 09/04/2020: Hemoglobin 12.6; Hemoglobin 12.9 11/18/2020: NT-Pro BNP 4,417 12/01/2020: BUN 9; Creatinine, Ser 0.67; Potassium 4.2; Sodium 142    Lipid Panel  No results found for: CHOL, TRIG, HDL, CHOLHDL, VLDL, LDLCALC, LDLDIRECT   Wt Readings from Last 3 Encounters:  02/01/21 197 lb 9.6 oz (89.6 kg)  01/18/21 195 lb (88.5 kg)  11/18/20 193 lb (87.5 kg)      Other studies Reviewed: Additional studies/ records that were reviewed today include: TTE 05/18/20  Review of the  above records today demonstrates:   1. Left ventricular ejection fraction, by estimation, is 30 to 35%. The  left ventricle has moderately decreased function. The left ventricle has  no regional wall motion abnormalities. There is mild left ventricular  hypertrophy. Left ventricular  diastolic parameters are consistent with Grade I diastolic dysfunction  (impaired relaxation). Unable to assess accurate EF secondary to frequent  ectopy.   2. Right ventricular systolic function is normal. The right ventricular  size is normal. There is normal pulmonary artery systolic pressure.   3. The mitral valve is normal in structure. Mild mitral valve  regurgitation. No evidence of mitral stenosis.   4. The aortic valve is normal  in structure. Aortic valve regurgitation is  not visualized. Mild aortic valve stenosis.  RHC/LHC 09/04/2020 1. Single vessel CAD with total occlusion of the mid circumflex (dominant vessel) 2. Mild aortic stenosis with mean gradient 15 mmHg (valve easily crossed with J-wire) 3. Normal right heart pressures and normal LVEDP  Cardiac monitor 10/12/2020 personally reviewed frequent ventricular ectopy with brief runs of nonsustained VT and frequent supraventricular ectopy with brief runs of SVT.  ASSESSMENT AND PLAN:  1.  Chronic systolic heart failure due to ischemic cardiomyopathy: Currently on Farxiga 10 mg, valsartan 40 mg.  No current chest pain.  Currently feeling well.  No obvious volume overload.  2.  Coronary artery disease: Has CTO of the LAD.  Currently on Plavix 75 mg.  3.  PVCs: Cardiac monitor shows an 11% burden.  Has also had some nonsustained VT.  Due to his age, would like to hold off on ICD implant for now.  He does have an elevated burden of PVCs.  Suppression of PVCs may provide benefit and potentially increase his ejection fraction.  We Albaro Deviney start him on 250 mg of mexiletine.  Case discussed with primary cardiology  Current medicines are reviewed at  length with the patient today.   The patient does not have concerns regarding his medicines.  The following changes were made today: Start mexiletine  Labs/ tests ordered today include:  Orders Placed This Encounter  Procedures   EKG 12-Lead     Disposition:   FU with Cathline Dowen 3 months  Signed, Taje Littler Derek Loa, MD  02/01/2021 3:07 PM     [facility] [address] [place] [address] [phone] (office) [phone] (fax)

## 2021-02-25 DIAGNOSIS — R0609 Other forms of dyspnea: Secondary | ICD-10-CM | POA: Diagnosis not present

## 2021-03-28 DIAGNOSIS — R0609 Other forms of dyspnea: Secondary | ICD-10-CM | POA: Diagnosis not present

## 2021-03-28 NOTE — Progress Notes (Deleted)
Cardiology Office Note:    Date:  03/28/2021   ID:  Derek [name], DOB [birth year], MRN [id number]  PCP:  Derek Meeker, FNP  Cardiologist:  Derek Herrlich, MD    Referring MD: Derek [name], F*    ASSESSMENT:    No diagnosis found. PLAN:    In order of problems listed above:  ***   Next appointment: ***   Medication Adjustments/Labs and Tests Ordered: Current medicines are reviewed at length with the patient today.  Concerns regarding medicines are outlined above.  No orders of the defined types were placed in this encounter.  No orders of the defined types were placed in this encounter.   No chief complaint on file.   History of Present Illness:    Derek [name] is a 80 y.o. male with a hx of CAD with chronic occlusion of mid left circumflex coronary artery mild aortic stenosis hypertension COPD peripheral arterial disease with remote left femoral-popliteal bypass graft more than 20 years ago and subsequent PCI and stent left common femoral and external iliac artery pulmonary nodules cardiomyopathy EF of 30 to 35% last seen 01/18/2021 with a predominant problem of shortness of breath.  His cardiac event monitor showed frequent ventricular ectopy with brief runs of nonsustained VT frequent supraventricular ectopy with brief runs of SVT.  He had 11% PVC burden.  An ICD was not recommended and he was placed on mexiletine to suppress his arrhythmia. Compliance with diet, lifestyle and medications: ***  Other studies Reviewed: Additional studies/ records that were reviewed today include: TTE 05/18/20  Review of the above records today demonstrates:   1. Left ventricular ejection fraction, by estimation, is 30 to 35%. The  left ventricle has moderately decreased function. The left ventricle has  no regional wall motion abnormalities. There is mild left ventricular  hypertrophy. Left ventricular  diastolic parameters are consistent with Grade I diastolic  dysfunction  (impaired relaxation). Unable to assess accurate EF secondary to frequent  ectopy.   2. Right ventricular systolic function is normal. The right ventricular  size is normal. There is normal pulmonary artery systolic pressure.   3. The mitral valve is normal in structure. Mild mitral valve  regurgitation. No evidence of mitral stenosis.   4. The aortic valve is normal in structure. Aortic valve regurgitation is  not visualized. Mild aortic valve stenosis.   RHC/LHC 09/04/2020 1. Single vessel CAD with total occlusion of the mid circumflex (dominant vessel) 2. Mild aortic stenosis with mean gradient 15 mmHg (valve easily crossed with J-wire) 3. Normal right heart pressures and normal LVEDP   Cardiac monitor 10/12/2020 personally reviewed frequent ventricular ectopy with brief runs of nonsustained VT and frequent supraventricular ectopy with brief runs of SVT.   Past Medical History:  Diagnosis Date   Androgen deficiency    Arthritis    Atherosclerosis of native arteries of the extremities with intermittent claudication 02/04/2014   B12 deficiency    Claudication [facility])    COPD (chronic obstructive pulmonary disease) (HCC)    COPD GOLD II     Pfts 07/15/13:  FeV1 1.46 (56%)  59% ratio 68   RV/TLC 145%  dlco  59%> 66%     Degenerative arthritis    Essential hypertension    Hypertension    IBS (irritable bowel syndrome)    Obese    PAD (peripheral artery disease) (HCC)    Pulmonary nodule    Pure hypercholesterolemia    Ulcerative colitis (HCC)  Past Surgical History:  Procedure Laterality Date   ABDOMINAL AORTOGRAM W/LOWER EXTREMITY N/A 10/30/2017   Procedure: ABDOMINAL AORTOGRAM W/LOWER EXTREMITY;  Surgeon: Maeola Harman, MD;  Location: [facility] INVASIVE CV LAB;  Service: Cardiovascular;  Laterality: N/A;   AORTA - ILIAC ARTERY BYPASS GRAFT     PERIPHERAL VASCULAR INTERVENTION  10/30/2017   Procedure: PERIPHERAL VASCULAR INTERVENTION;  Surgeon: Maeola Harman, MD;  Location: [facility] INVASIVE CV LAB;  Service: Cardiovascular;;  LT Iliac   RIGHT/LEFT HEART CATH AND CORONARY ANGIOGRAPHY N/A 09/04/2020   Procedure: RIGHT/LEFT HEART CATH AND CORONARY ANGIOGRAPHY;  Surgeon: Tonny Bollman, MD;  Location: [facility] INVASIVE CV LAB;  Service: Cardiovascular;  Laterality: N/A;    Current Medications: No outpatient medications have been marked as taking for the 03/29/21 encounter (Appointment) with Derek Daub, MD.     Allergies:   Aleve [naproxen sodium], Entresto [sacubitril-valsartan], and Metoprolol   Social History   Socioeconomic History   Marital status: Married    Spouse name: Not on file   Number of children: Not on file   Years of education: Not on file   Highest education level: Not on file  Occupational History   Occupation: Retired  Tobacco Use   Smoking status: Former    Packs/day: 2.50    Years: 46.00    Pack years: 115.00    Types: Cigarettes    Quit date: 05/23/1996    Years since quitting: 24.8   Smokeless tobacco: Never  Vaping Use   Vaping Use: Never used  Substance and Sexual Activity   Alcohol use: Yes    Alcohol/week: 3.0 standard drinks    Types: 3 Cans of beer per week   Drug use: No   Sexual activity: Not on file  Other Topics Concern   Not on file  Social History Narrative   Not on file   Social Determinants of Health   Financial Resource Strain: Not on file  Food Insecurity: Not on file  Transportation Needs: Not on file  Physical Activity: Not on file  Stress: Not on file  Social Connections: Not on file     Family History: The patient's ***family history includes Alzheimer's disease in his father; Dementia in his mother. ROS:   Please see the history of present illness.    All other systems reviewed and are negative.  EKGs/Labs/Other Studies Reviewed:    The following studies were reviewed today:  EKG:  EKG ordered today and personally reviewed.  The ekg ordered today demonstrates  ***  Recent Labs: 09/04/2020: Hemoglobin 12.6; Hemoglobin 12.9 11/18/2020: NT-Pro BNP 4,417 12/01/2020: BUN 9; Creatinine, Ser 0.67; Potassium 4.2; Sodium 142  Recent Lipid Panel No results found for: CHOL, TRIG, HDL, CHOLHDL, VLDL, LDLCALC, LDLDIRECT  Physical Exam:    VS:  There were no vitals taken for this visit.    Wt Readings from Last 3 Encounters:  02/01/21 197 lb 9.6 oz (89.6 kg)  01/18/21 195 lb (88.5 kg)  11/18/20 193 lb (87.5 kg)     GEN: *** Well nourished, well developed in no acute distress HEENT: Normal NECK: No JVD; No carotid bruits LYMPHATICS: No lymphadenopathy CARDIAC: ***RRR, no murmurs, rubs, gallops RESPIRATORY:  Clear to auscultation without rales, wheezing or rhonchi  ABDOMEN: Soft, non-tender, non-distended MUSCULOSKELETAL:  No edema; No deformity  SKIN: Warm and dry NEUROLOGIC:  Alert and oriented x 3 PSYCHIATRIC:  Normal affect    Signed, Derek Herrlich, MD  03/28/2021 12:02 PM    [facility]

## 2021-03-29 ENCOUNTER — Other Ambulatory Visit: Payer: Self-pay

## 2021-03-29 ENCOUNTER — Ambulatory Visit: Payer: Medicare Other | Admitting: Cardiology

## 2021-04-01 ENCOUNTER — Encounter: Payer: Self-pay | Admitting: Cardiology

## 2021-04-01 ENCOUNTER — Ambulatory Visit: Payer: Medicare Other | Admitting: Cardiology

## 2021-04-01 ENCOUNTER — Other Ambulatory Visit: Payer: Self-pay

## 2021-04-01 VITALS — BP 132/72 | HR 80 | Ht 71.0 in | Wt 199.6 lb

## 2021-04-01 DIAGNOSIS — I350 Nonrheumatic aortic (valve) stenosis: Secondary | ICD-10-CM

## 2021-04-01 DIAGNOSIS — I255 Ischemic cardiomyopathy: Secondary | ICD-10-CM | POA: Diagnosis not present

## 2021-04-01 DIAGNOSIS — I25118 Atherosclerotic heart disease of native coronary artery with other forms of angina pectoris: Secondary | ICD-10-CM

## 2021-04-01 DIAGNOSIS — I493 Ventricular premature depolarization: Secondary | ICD-10-CM

## 2021-04-01 DIAGNOSIS — I5022 Chronic systolic (congestive) heart failure: Secondary | ICD-10-CM | POA: Diagnosis not present

## 2021-04-01 DIAGNOSIS — J449 Chronic obstructive pulmonary disease, unspecified: Secondary | ICD-10-CM

## 2021-04-01 MED ORDER — VERQUVO 5 MG PO TABS: 5.0000 mg | ORAL_TABLET | Freq: Every day | ORAL | 3 refills | Status: DC

## 2021-04-01 NOTE — Progress Notes (Signed)
Cardiology Office Note:    Date:  04/01/2021   ID:  Holland Commons, DOB 14-Dec-1940, MRN [id number]  PCP:  Leilani Able, FNP  Cardiologist:  Shirlee More, MD    Referring MD: Farrel Conners*    ASSESSMENT:    1. PVC's (premature ventricular contractions)   2. Ischemic cardiomyopathy   3. Chronic systolic heart failure ([place])   4. Coronary artery disease of native artery of native heart with stable angina pectoris ([place])   5. Mild aortic stenosis   6. COPD GOLD II     PLAN:    In order of problems listed above:  With a combination of antiarrhythmic drug therapy and optimization of medications for heart failure he has a marked functional improvement and from the perspective of heart failure is New York Heart Association class I we will continue his antiarrhythmic drug check an EKG for toxicity also check routine labs including liver function.  At this time does not require an ICD Heart failure is remarkably improved and I think using VERICIQUAT and SGLT2 inhibitor has been a Higher education careers adviser.  Continue his current treatment increase the dose check proBNP level. Stable CAD having no anginal discomfort continue medical treatment Stable aortic stenosis Stable COPD on his current bronchodilator   Next appointment: 3 months   Medication Adjustments/Labs and Tests Ordered: Current medicines are reviewed at length with the patient today.  Concerns regarding medicines are outlined above.  No orders of the defined types were placed in this encounter.  No orders of the defined types were placed in this encounter.   Chief Complaint  Patient presents with   Follow-up   Congestive Heart Failure   Coronary Artery Disease  He has now taking mexiletine for PVC suppression  History of Present Illness:    Derek [name] is a 80 y.o. male with a hx of CAD with chronic occlusion mid left circumflex coronary artery mild aortic stenosis PAD frequent PVCs and APCs with short  episodes of SVT cardiomyopathy EF 30 to 35% hypertensive heart disease and syncope last seen 01/18/2021.  He has multiple mechanisms for shortness of breath including his cardiomyopathy CAD underlying lung disease and was improved with steroids and bronchodilators.  He was intolerant of Entresto and beta-blocker.  Compliance with diet, lifestyle and medications: Yes  He is remarkably improved strength and endurance breathing he is back to golfing with his friends. Is still limited by exertional claudication he is not having palpitation or lightheadedness He has no muscle pain or weakness from his statin He tolerates clopidogrel without GI side effect    He was seen subsequently by electrophysiology 02/01/2021 with a PVC burden of 11% and nonsustained ventricular tachycardia due to his age decision was made to hold on ICD therapy.  He is initiated mexiletine for PVC suppression. Past Medical History:  Diagnosis Date   Androgen deficiency    Arthritis    Atherosclerosis of native arteries of the extremities with intermittent claudication 02/04/2014   B12 deficiency    Claudication [facility])    COPD (chronic obstructive pulmonary disease) (HCC)    COPD GOLD II     Pfts 07/15/13:  FeV1 1.46 (56%)  59% ratio 68   RV/TLC 145%  dlco  59%> 66%     Degenerative arthritis    Essential hypertension    Hypertension    IBS (irritable bowel syndrome)    Obese    PAD (peripheral artery disease) (HCC)    Pulmonary nodule  Pure hypercholesterolemia    Ulcerative colitis [facility])     Past Surgical History:  Procedure Laterality Date   ABDOMINAL AORTOGRAM W/LOWER EXTREMITY N/A 10/30/2017   Procedure: ABDOMINAL AORTOGRAM W/LOWER EXTREMITY;  Surgeon: Maeola Harman, MD;  Location: [facility] INVASIVE CV LAB;  Service: Cardiovascular;  Laterality: N/A;   AORTA - ILIAC ARTERY BYPASS GRAFT     PERIPHERAL VASCULAR INTERVENTION  10/30/2017   Procedure: PERIPHERAL VASCULAR INTERVENTION;  Surgeon: Maeola Harman, MD;  Location: [facility] INVASIVE CV LAB;  Service: Cardiovascular;;  LT Iliac   RIGHT/LEFT HEART CATH AND CORONARY ANGIOGRAPHY N/A 09/04/2020   Procedure: RIGHT/LEFT HEART CATH AND CORONARY ANGIOGRAPHY;  Surgeon: Tonny Bollman, MD;  Location: [facility] INVASIVE CV LAB;  Service: Cardiovascular;  Laterality: N/A;    Current Medications: Current Meds  Medication Sig   Ascorbic Acid (VITAMIN C) 1000 MG tablet Take 1,000 mg by mouth daily.   atorvastatin (LIPITOR) 10 MG tablet Take 10 mg by mouth at bedtime.   BREZTRI AEROSPHERE 160-9-4.8 MCG/ACT AERO Inhale 1 puff into the lungs in the morning and at bedtime.   clopidogrel (PLAVIX) 75 MG tablet Take 75 mg by mouth daily.   Cyanocobalamin (B-12) 1000 MCG TABS Take 1 tablet by mouth daily.   dapagliflozin propanediol (FARXIGA) 10 MG TABS tablet Take 1 tablet (10 mg total) by mouth daily before breakfast.   ferrous sulfate 325 (65 FE) MG tablet Take 325 mg by mouth daily with breakfast.   furosemide (LASIX) 20 MG tablet Take 20 mg by mouth daily.   mesalamine (LIALDA) 1.2 g EC tablet Take 4.8 g by mouth every morning.   mexiletine (MEXITIL) 250 MG capsule Take 1 capsule (250 mg total) by mouth 2 (two) times daily.   montelukast (SINGULAIR) 10 MG tablet Take 10 mg by mouth daily.   predniSONE (DELTASONE) 10 MG tablet Take 10 mg by mouth every morning.   valsartan (DIOVAN) 40 MG tablet Take 40 mg by mouth 2 (two) times daily.   VERQUVO 2.5 MG TABS TAKE 2.5 MG BY MOUTH DAILY.   Zinc Oxide 10 % OINT Apply 1 application topically as needed (burning).     Allergies:   Aleve [naproxen sodium], Entresto [sacubitril-valsartan], and Metoprolol   Social History   Socioeconomic History   Marital status: Married    Spouse name: Not on file   Number of children: Not on file   Years of education: Not on file   Highest education level: Not on file  Occupational History   Occupation: Retired  Tobacco Use   Smoking status: Former    Packs/day: 2.50     Years: 46.00    Pack years: 115.00    Types: Cigarettes    Quit date: 05/23/1996    Years since quitting: 24.8   Smokeless tobacco: Never  Vaping Use   Vaping Use: Never used  Substance and Sexual Activity   Alcohol use: Yes    Alcohol/week: 3.0 standard drinks    Types: 3 Cans of beer per week   Drug use: No   Sexual activity: Not on file  Other Topics Concern   Not on file  Social History Narrative   Not on file   Social Determinants of Health   Financial Resource Strain: Not on file  Food Insecurity: Not on file  Transportation Needs: Not on file  Physical Activity: Not on file  Stress: Not on file  Social Connections: Not on file     Family History: The patient's family history  includes Alzheimer's disease in his father; Dementia in his mother. ROS:   Please see the history of present illness.    All other systems reviewed and are negative.  EKGs/Labs/Other Studies Reviewed:    The following studies were reviewed today:  EKG:  EKG ordered today and personally reviewed.  The ekg ordered today demonstrates sinus rhythm normal QT interval minor nonspecific ST and T abnormality  Recent Labs: 09/04/2020: Hemoglobin 12.6; Hemoglobin 12.9 11/18/2020: NT-Pro BNP 4,417 12/01/2020: BUN 9; Creatinine, Ser 0.67; Potassium 4.2; Sodium 142  Recent Lipid Panel No results found for: CHOL, TRIG, HDL, CHOLHDL, VLDL, LDLCALC, LDLDIRECT  Physical Exam:    VS:  BP 132/72   Pulse 80   Ht 5\' 11"  (1.803 m)   Wt 199 lb 9.6 oz (90.5 kg)   SpO2 95%   BMI 27.84 kg/m     Wt Readings from Last 3 Encounters:  04/01/21 199 lb 9.6 oz (90.5 kg)  02/01/21 197 lb 9.6 oz (89.6 kg)  01/18/21 195 lb (88.5 kg)     GEN:  Well nourished, well developed in no acute distress HEENT: Normal NECK: No JVD; No carotid bruits LYMPHATICS: No lymphadenopathy CARDIAC: RRR, no murmurs, rubs, gallops RESPIRATORY:  Clear to auscultation without rales, wheezing or rhonchi  ABDOMEN: Soft, non-tender,  non-distended MUSCULOSKELETAL:  No edema; No deformity  SKIN: Warm and dry NEUROLOGIC:  Alert and oriented x 3 PSYCHIATRIC:  Normal affect    Signed, Shirlee More, MD  04/01/2021 10:29 AM    [facility]

## 2021-04-01 NOTE — Patient Instructions (Signed)
Medication Instructions:  Your physician has recommended you make the following change in your medication:  NEXT PRESCRIPTION: INCREASE Verquvo to 5 mg by mouth daily.  *If you need a refill on your cardiac medications before your next appointment, please call your pharmacy*   Lab Work: Your physician recommends that you return for lab work in: TODAY CMP, ProBNP If you have labs (blood work) drawn today and your tests are completely normal, you will receive your results only by: MyChart Message (if you have MyChart) OR A paper copy in the mail If you have any lab test that is abnormal or we need to change your treatment, we will call you to review the results.   Testing/Procedures: None   Follow-Up: At [facility], you and your health needs are our priority.  As part of our continuing mission to provide you with exceptional heart care, we have created designated Provider Care Teams.  These Care Teams include your primary Cardiologist (physician) and Advanced Practice Providers (APPs -  Physician Assistants and Nurse Practitioners) who all work together to provide you with the care you need, when you need it.  We recommend signing up for the patient portal called "MyChart".  Sign up information is provided on this After Visit Summary.  MyChart is used to connect with patients for Virtual Visits (Telemedicine).  Patients are able to view lab/test results, encounter notes, upcoming appointments, etc.  Non-urgent messages can be sent to your provider as well.   To learn more about what you can do with MyChart, go to [email or web address].    Your next appointment:   3 month(s)  The format for your next appointment:   In Person  Provider:   Norman Herrlich, MD    Other Instructions

## 2021-04-02 LAB — COMPREHENSIVE METABOLIC PANEL
ALT: 22 IU/L (ref 0–44)
AST: 24 IU/L (ref 0–40)
Albumin/Globulin Ratio: 1.9 (ref 1.2–2.2)
Albumin: 4.3 g/dL (ref 3.6–4.6)
Alkaline Phosphatase: 74 IU/L (ref 44–121)
BUN/Creatinine Ratio: 15 (ref 10–24)
BUN: 14 mg/dL (ref 8–27)
Bilirubin Total: 0.4 mg/dL (ref 0.0–1.2)
CO2: 26 mmol/L (ref 20–29)
Calcium: 9.3 mg/dL (ref 8.6–10.2)
Chloride: 104 mmol/L (ref 96–106)
Creatinine, Ser: 0.96 mg/dL (ref 0.76–1.27)
Globulin, Total: 2.3 g/dL (ref 1.5–4.5)
Glucose: 97 mg/dL (ref 70–99)
Potassium: 3.6 mmol/L (ref 3.5–5.2)
Sodium: 142 mmol/L (ref 134–144)
Total Protein: 6.6 g/dL (ref 6.0–8.5)
eGFR: 79 mL/min/{1.73_m2} (ref >59)

## 2021-04-02 LAB — PRO B NATRIURETIC PEPTIDE: NT-Pro BNP: 7410 pg/mL — ABNORMAL HIGH (ref 0–486)

## 2021-04-05 ENCOUNTER — Other Ambulatory Visit: Payer: Self-pay | Admitting: Cardiology

## 2021-04-27 DIAGNOSIS — R0609 Other forms of dyspnea: Secondary | ICD-10-CM | POA: Diagnosis not present

## 2021-05-05 ENCOUNTER — Other Ambulatory Visit: Payer: Self-pay

## 2021-05-05 DIAGNOSIS — I6529 Occlusion and stenosis of unspecified carotid artery: Secondary | ICD-10-CM

## 2021-05-05 DIAGNOSIS — I739 Peripheral vascular disease, unspecified: Secondary | ICD-10-CM

## 2021-05-19 ENCOUNTER — Other Ambulatory Visit: Payer: Self-pay | Admitting: Cardiology

## 2021-05-26 ENCOUNTER — Encounter (HOSPITAL_COMMUNITY): Payer: Medicare Other | Admitting: Cardiology

## 2021-05-28 DIAGNOSIS — R0609 Other forms of dyspnea: Secondary | ICD-10-CM | POA: Diagnosis not present

## 2021-05-31 ENCOUNTER — Ambulatory Visit: Payer: Medicare Other | Admitting: Cardiology

## 2021-05-31 ENCOUNTER — Encounter: Payer: Self-pay | Admitting: Cardiology

## 2021-05-31 ENCOUNTER — Other Ambulatory Visit: Payer: Self-pay

## 2021-05-31 VITALS — BP 98/56 | HR 83 | Ht 71.0 in | Wt 200.4 lb

## 2021-05-31 DIAGNOSIS — I493 Ventricular premature depolarization: Secondary | ICD-10-CM

## 2021-05-31 NOTE — Progress Notes (Signed)
Electrophysiology Office Note   Date:  05/31/2021   ID:  AASHISH ECKHART, DOB 1940-11-21, MRN [id number]  PCP:  Ninfa Meeker, FNP  Cardiologist: Dulce Sellar Primary Electrophysiologist:  Mihir Flanigan Jorja Loa, MD    Chief Complaint: PVCs   History of Present Illness: Derek [name] is a 81 y.o. male who is being seen today for the evaluation of PVCs at the request of Maurie Boettcher T, F*. Presenting today for electrophysiology evaluation.  He has a history significant for coronary artery disease with CTO of the circumflex, aortic stenosis, PVCs, SVT, ischemic cardiomyopathy with an ejection fraction of 30 to 35%.  He has PAD with left femoropopliteal bypass more than 20 years ago and PCI and stenting of the left common femoral and internal iliac June 2019.  He also has COPD.  He was put on mexiletine for his PVCs.  Today, denies symptoms of palpitations, chest pain, shortness of breath, orthopnea, PND, lower extremity edema, claudication, dizziness, presyncope, syncope, bleeding, or neurologic sequela. The patient is tolerating medications without difficulties.  He is currently feeling well.  He has no chest pain or shortness of breath.  He is able to all of his daily activities.  He continues to play golf without issue.  He has noted no further palpitations.  He overall feels well.   Past Medical History:  Diagnosis Date   Androgen deficiency    Arthritis    Atherosclerosis of native arteries of the extremities with intermittent claudication 02/04/2014   B12 deficiency    Claudication [facility])    COPD (chronic obstructive pulmonary disease) (HCC)    COPD GOLD II     Pfts 07/15/13:  FeV1 1.46 (56%)  59% ratio 68   RV/TLC 145%  dlco  59%> 66%     Degenerative arthritis    Essential hypertension    Hypertension    IBS (irritable bowel syndrome)    Obese    PAD (peripheral artery disease) (HCC)    Pulmonary nodule    Pure hypercholesterolemia    Ulcerative colitis (HCC)    Past  Surgical History:  Procedure Laterality Date   ABDOMINAL AORTOGRAM W/LOWER EXTREMITY N/A 10/30/2017   Procedure: ABDOMINAL AORTOGRAM W/LOWER EXTREMITY;  Surgeon: Maeola Harman, MD;  Location: [facility] INVASIVE CV LAB;  Service: Cardiovascular;  Laterality: N/A;   AORTA - ILIAC ARTERY BYPASS GRAFT     PERIPHERAL VASCULAR INTERVENTION  10/30/2017   Procedure: PERIPHERAL VASCULAR INTERVENTION;  Surgeon: Maeola Harman, MD;  Location: [facility] INVASIVE CV LAB;  Service: Cardiovascular;;  LT Iliac   RIGHT/LEFT HEART CATH AND CORONARY ANGIOGRAPHY N/A 09/04/2020   Procedure: RIGHT/LEFT HEART CATH AND CORONARY ANGIOGRAPHY;  Surgeon: Tonny Bollman, MD;  Location: [facility] INVASIVE CV LAB;  Service: Cardiovascular;  Laterality: N/A;     Current Outpatient Medications  Medication Sig Dispense Refill   Ascorbic Acid (VITAMIN C) 1000 MG tablet Take 1,000 mg by mouth daily.     atorvastatin (LIPITOR) 10 MG tablet Take 10 mg by mouth at bedtime.  5   BREZTRI AEROSPHERE 160-9-4.8 MCG/ACT AERO Inhale 1 puff into the lungs in the morning and at bedtime.     clopidogrel (PLAVIX) 75 MG tablet Take 75 mg by mouth daily.     Cyanocobalamin (B-12) 1000 MCG TABS Take 1 tablet by mouth daily.     dapagliflozin propanediol (FARXIGA) 10 MG TABS tablet Take 1 tablet (10 mg total) by mouth daily before breakfast. 90 tablet 3   ferrous sulfate 325 (65  FE) MG tablet Take 325 mg by mouth daily with breakfast.     furosemide (LASIX) 20 MG tablet Take 20 mg by mouth daily.     mesalamine (LIALDA) 1.2 g EC tablet Take 4.8 g by mouth every morning.     mexiletine (MEXITIL) 250 MG capsule TAKE 1 CAPSULE BY MOUTH 2 TIMES DAILY. 180 capsule 1   montelukast (SINGULAIR) 10 MG tablet Take 10 mg by mouth daily.     predniSONE (DELTASONE) 10 MG tablet Take 10 mg by mouth every morning.     valsartan (DIOVAN) 40 MG tablet Take 40 mg by mouth 2 (two) times daily.     Vericiguat (VERQUVO) 5 MG TABS Take 1 tablet (5 mg total) by  mouth daily. 90 tablet 3   Zinc Oxide 10 % OINT Apply 1 application topically as needed (burning).     No current facility-administered medications for this visit.    Allergies:   Aleve [naproxen sodium], Entresto [sacubitril-valsartan], and Metoprolol   Social History:  The patient  reports that he quit smoking about 25 years ago. His smoking use included cigarettes. He has a 115.00 pack-year smoking history. He has never used smokeless tobacco. He reports current alcohol use of about 3.0 standard drinks per week. He reports that he does not use drugs.   Family History:  The patient's family history includes Alzheimer's disease in his father; Dementia in his mother.    ROS:  Please see the history of present illness.   Otherwise, review of systems is positive for none.   All other systems are reviewed and negative.   PHYSICAL EXAM: VS:  BP (!) 98/56    Pulse 83    Ht 5\' 11"  (1.803 m)    Wt 200 lb 6.4 oz (90.9 kg)    SpO2 90%    BMI 27.95 kg/m  , BMI Body mass index is 27.95 kg/m. GEN: Well nourished, well developed, in no acute distress  HEENT: normal  Neck: no JVD, carotid bruits, or masses Cardiac: RRR; no murmurs, rubs, or gallops,no edema  Respiratory:  clear to auscultation bilaterally, normal work of breathing GI: soft, nontender, nondistended, + BS MS: no deformity or atrophy  Skin: warm and dry Neuro:  Strength and sensation are intact Psych: euthymic mood, full affect  EKG:  EKG is ordered today. Personal review of the ekg ordered shows sinus rhythm, PACs  Recent Labs: 09/04/2020: Hemoglobin 12.6; Hemoglobin 12.9 04/01/2021: ALT 22; BUN 14; Creatinine, Ser 0.96; NT-Pro BNP 7,410; Potassium 3.6; Sodium 142    Lipid Panel  No results found for: CHOL, TRIG, HDL, CHOLHDL, VLDL, LDLCALC, LDLDIRECT   Wt Readings from Last 3 Encounters:  05/31/21 200 lb 6.4 oz (90.9 kg)  04/01/21 199 lb 9.6 oz (90.5 kg)  02/01/21 197 lb 9.6 oz (89.6 kg)      Other studies  Reviewed: Additional studies/ records that were reviewed today include: TTE 05/18/20  Review of the above records today demonstrates:   1. Left ventricular ejection fraction, by estimation, is 30 to 35%. The  left ventricle has moderately decreased function. The left ventricle has  no regional wall motion abnormalities. There is mild left ventricular  hypertrophy. Left ventricular  diastolic parameters are consistent with Grade I diastolic dysfunction  (impaired relaxation). Unable to assess accurate EF secondary to frequent  ectopy.   2. Right ventricular systolic function is normal. The right ventricular  size is normal. There is normal pulmonary artery systolic pressure.   3. The  mitral valve is normal in structure. Mild mitral valve  regurgitation. No evidence of mitral stenosis.   4. The aortic valve is normal in structure. Aortic valve regurgitation is  not visualized. Mild aortic valve stenosis.  RHC/LHC 09/04/2020 1. Single vessel CAD with total occlusion of the mid circumflex (dominant vessel) 2. Mild aortic stenosis with mean gradient 15 mmHg (valve easily crossed with J-wire) 3. Normal right heart pressures and normal LVEDP  Cardiac monitor 10/12/2020 personally reviewed frequent ventricular ectopy with brief runs of nonsustained VT and frequent supraventricular ectopy with brief runs of SVT.  ASSESSMENT AND PLAN:  1.  Chronic systolic heart failure due to ischemic cardiomyopathy: Currently on Farxiga 10 mg, valsartan 40 mg.  No obvious volume overload.  2.  Coronary artery disease: CTO of the LAD.  Currently on Plavix 75 mg.  No current chest pain.  3.  PVCs: Cardiac monitor with an 11% burden.  Holding off on ICD therapy due to his age of 64.  Currently on mexiletine 250 mg twice daily.  High risk medication monitoring for Mexiletine. No PVCs on ECG today.  No changes.  Current medicines are reviewed at length with the patient today.   The patient does not have concerns  regarding his medicines.  The following changes were made today: None  Labs/ tests ordered today include:  Orders Placed This Encounter  Procedures   EKG 12-Lead     Disposition:   FU with Raechelle Sarti 6 months  Signed, Jalene Lacko Meredith Leeds, MD  05/31/2021 3:07 PM     [facility] [address] [place] [address] [phone] (office) [phone] (fax)

## 2021-06-28 DIAGNOSIS — R0609 Other forms of dyspnea: Secondary | ICD-10-CM | POA: Diagnosis not present

## 2021-06-30 ENCOUNTER — Encounter (HOSPITAL_COMMUNITY): Payer: Medicare Other

## 2021-06-30 ENCOUNTER — Ambulatory Visit: Payer: Medicare Other

## 2021-07-01 ENCOUNTER — Telehealth: Payer: Self-pay | Admitting: Cardiology

## 2021-07-01 NOTE — Telephone Encounter (Signed)
Patient is calling stating that he is having excessive exhaustion. Reports this has been going on for the past 10 days. States BP is ranging 130/80. Requesting Dr. Hulen Shouts recommendation.

## 2021-07-01 NOTE — Telephone Encounter (Signed)
Spoke with Derek [name] and advised of Dr Hulen Shouts recommendation to see PCP tomorrow 07/02/2021.  Derek [name] advised appointment scheduled with Dr Dulce Sellar 07/08/2021 at 1240pm.  Derek [name] verbalizes understanding and agrees with current plan.

## 2021-07-01 NOTE — Telephone Encounter (Signed)
Spoke with pt who complains of severe fatigue along with increasing dyspnea on exertion x 10 days.  Pt states he is currently using his wife's O2 at 2L due to his O2 sat being 91.  Pt states with O2 it has improved to 97%.  Pt reports he will do this periodically when his O2 level drops.  He denies current CP, edema or dizziness.  He reports BP is stable.  He is taking medications as prescribed.  Pt is due this month for follow up with Dr Dulce Sellar. Pt advised will forward information to Dr Dulce Sellar for review and recommendation and to [facility] pool to schedule appointment.  Reviewed ED precautions.  Pt verbalizes understanding and agrees with current plan.

## 2021-07-02 DIAGNOSIS — E559 Vitamin D deficiency, unspecified: Secondary | ICD-10-CM | POA: Diagnosis not present

## 2021-07-02 DIAGNOSIS — R9430 Abnormal result of cardiovascular function study, unspecified: Secondary | ICD-10-CM | POA: Diagnosis not present

## 2021-07-02 DIAGNOSIS — D519 Vitamin B12 deficiency anemia, unspecified: Secondary | ICD-10-CM | POA: Diagnosis not present

## 2021-07-02 DIAGNOSIS — R5381 Other malaise: Secondary | ICD-10-CM | POA: Diagnosis not present

## 2021-07-02 DIAGNOSIS — R0609 Other forms of dyspnea: Secondary | ICD-10-CM | POA: Diagnosis not present

## 2021-07-02 DIAGNOSIS — R5383 Other fatigue: Secondary | ICD-10-CM | POA: Diagnosis not present

## 2021-07-02 DIAGNOSIS — D649 Anemia, unspecified: Secondary | ICD-10-CM | POA: Diagnosis not present

## 2021-07-08 ENCOUNTER — Ambulatory Visit: Payer: Medicare Other | Admitting: Cardiology

## 2021-07-08 ENCOUNTER — Encounter: Payer: Self-pay | Admitting: Cardiology

## 2021-07-08 ENCOUNTER — Other Ambulatory Visit: Payer: Self-pay

## 2021-07-08 VITALS — BP 108/58 | HR 64 | Ht 71.0 in | Wt 199.0 lb

## 2021-07-08 DIAGNOSIS — I493 Ventricular premature depolarization: Secondary | ICD-10-CM

## 2021-07-08 DIAGNOSIS — I350 Nonrheumatic aortic (valve) stenosis: Secondary | ICD-10-CM | POA: Diagnosis not present

## 2021-07-08 DIAGNOSIS — I255 Ischemic cardiomyopathy: Secondary | ICD-10-CM

## 2021-07-08 DIAGNOSIS — I25118 Atherosclerotic heart disease of native coronary artery with other forms of angina pectoris: Secondary | ICD-10-CM | POA: Diagnosis not present

## 2021-07-08 DIAGNOSIS — I5022 Chronic systolic (congestive) heart failure: Secondary | ICD-10-CM | POA: Diagnosis not present

## 2021-07-08 MED ORDER — VERICIGUAT 10 MG PO TABS: 10.0000 mg | ORAL_TABLET | Freq: Every day | ORAL | 3 refills | Status: DC

## 2021-07-08 MED ORDER — NITROGLYCERIN 0.4 MG SL SUBL: 0.4000 mg | SUBLINGUAL_TABLET | SUBLINGUAL | 3 refills | Status: DC | PRN

## 2021-07-08 NOTE — Patient Instructions (Signed)
Medication Instructions:  Your physician has recommended you make the following change in your medication:  INCREASE: Vericiguat 10 mg take one tablet by mouth daily.  *If you need a refill on your cardiac medications before your next appointment, please call your pharmacy*   Lab Work: None If you have labs (blood work) drawn today and your tests are completely normal, you will receive your results only by: MyChart Message (if you have MyChart) OR A paper copy in the mail If you have any lab test that is abnormal or we need to change your treatment, we will call you to review the results.   Testing/Procedures: None   Follow-Up: At [facility], you and your health needs are our priority.  As part of our continuing mission to provide you with exceptional heart care, we have created designated Provider Care Teams.  These Care Teams include your primary Cardiologist (physician) and Advanced Practice Providers (APPs -  Physician Assistants and Nurse Practitioners) who all work together to provide you with the care you need, when you need it.  We recommend signing up for the patient portal called "MyChart".  Sign up information is provided on this After Visit Summary.  MyChart is used to connect with patients for Virtual Visits (Telemedicine).  Patients are able to view lab/test results, encounter notes, upcoming appointments, etc.  Non-urgent messages can be sent to your provider as well.   To learn more about what you can do with MyChart, go to [email or web address].    Your next appointment:   4 month(s)  The format for your next appointment:   In Person  Provider:   Norman Herrlich, MD    Other Instructions

## 2021-07-08 NOTE — Progress Notes (Signed)
Cardiology Office Note:    Date:  07/08/2021   ID:  Derek [name], DOB 22-Dec-1940, MRN [id number]  PCP:  Derek Meeker, FNP  Cardiologist:  Derek Herrlich, MD    Referring MD: Derek [name]*    ASSESSMENT:    1. Chronic systolic heart failure (HCC)   2. Ischemic cardiomyopathy   3. Coronary artery disease of native artery of native heart with stable angina pectoris (HCC)   4. PVC's (premature ventricular contractions)   5. Mild aortic stenosis    PLAN:    In order of problems listed above:  He continues to be symptomatic with his cardiomyopathy and heart failure.  No fluid overload continue his current loop diuretic he has been intolerant of many cardiac medications but fortunately tolerates his SGLT2 inhibitor ARB and will increase his vericiquat to improve the quality of his life. Stable CAD given a prescription for nitroglycerin he can try for his nonanginal chest pain severe continue clopidogrel and statin. Stable on mexiletine Stable consider repeat echocardiogram around the time of his next visit   Next appointment: 4 months   Medication Adjustments/Labs and Tests Ordered: Current medicines are reviewed at length with the patient today.  Concerns regarding medicines are outlined above.  No orders of the defined types were placed in this encounter.  Meds ordered this encounter  Medications   Vericiguat 10 MG TABS    Sig: Take 10 mg by mouth daily.    Dispense:  90 tablet    Refill:  3    If complaint follow-up for cardiomyopathy   History of Present Illness:    Derek [name] is a 81 y.o. male with a hx of very complex heart disease including CAD with chronic occlusion of the left circumflex coronary artery mild aortic stenosis frequent PVCs and APCs with brief episodes of SVT cardiomyopathy EF 30 to 35% hypertensive heart disease and peripheral arterial disease.  He has multiple mechanisms for shortness of breath including cardiomyopathy CAD  underlying lung disease treated with steroids and bronchodilators.  He has been intolerant of both Entresto and beta-blocker.  He has been seen by EP for his PVC burden and decision was made to hold on ICD therapy and was initiated on mexiletine to suppress his PVCs.  He wants last seen 04/01/2021 improved with heart failure with a combination of SGLT2 inhibitor AND VERICIQUAT. Compliance with diet, lifestyle and medications: Yes  Derek [name] is improved but is not happy with the quality of his life when he tries to do activities like golf he tires easily he told he just had lab work from his PCP office phone after the results are done and included checking a testosterone level.  He is not having edema orthopnea shortness of breath palpitation or syncope.  He is having nonanginal chest discomfort he gets very cold in his chest and passes quickly.  We will give him a prescription for nitroglycerin if he has a severe episode he can take it. Most recent labs 07/02/2021 hemoglobin 14.9 creatinine 1.10 potassium 3.9 Past Medical History:  Diagnosis Date   Androgen deficiency    Arthritis    Atherosclerosis of native arteries of the extremities with intermittent claudication 02/04/2014   B12 deficiency    Claudication [facility])    COPD (chronic obstructive pulmonary disease) (HCC)    COPD GOLD II     Pfts 07/15/13:  FeV1 1.46 (56%)  59% ratio 68   RV/TLC 145%  dlco  59%> 66%  Degenerative arthritis    Essential hypertension    Hypertension    IBS (irritable bowel syndrome)    Obese    PAD (peripheral artery disease) (HCC)    Pulmonary nodule    Pure hypercholesterolemia    Ulcerative colitis (HCC)     Past Surgical History:  Procedure Laterality Date   ABDOMINAL AORTOGRAM W/LOWER EXTREMITY N/A 10/30/2017   Procedure: ABDOMINAL AORTOGRAM W/LOWER EXTREMITY;  Surgeon: Maeola Harman, MD;  Location: [facility] INVASIVE CV LAB;  Service: Cardiovascular;  Laterality: N/A;   AORTA - ILIAC ARTERY BYPASS  GRAFT     PERIPHERAL VASCULAR INTERVENTION  10/30/2017   Procedure: PERIPHERAL VASCULAR INTERVENTION;  Surgeon: Maeola Harman, MD;  Location: [facility] INVASIVE CV LAB;  Service: Cardiovascular;;  LT Iliac   RIGHT/LEFT HEART CATH AND CORONARY ANGIOGRAPHY N/A 09/04/2020   Procedure: RIGHT/LEFT HEART CATH AND CORONARY ANGIOGRAPHY;  Surgeon: Tonny Bollman, MD;  Location: [facility] INVASIVE CV LAB;  Service: Cardiovascular;  Laterality: N/A;    Current Medications: Current Meds  Medication Sig   Ascorbic Acid (VITAMIN C) 1000 MG tablet Take 1,000 mg by mouth daily.   atorvastatin (LIPITOR) 10 MG tablet Take 10 mg by mouth at bedtime.   BREZTRI AEROSPHERE 160-9-4.8 MCG/ACT AERO Inhale 1 puff into the lungs in the morning and at bedtime.   clopidogrel (PLAVIX) 75 MG tablet Take 75 mg by mouth daily.   Cyanocobalamin (B-12) 1000 MCG TABS Take 1 tablet by mouth daily.   dapagliflozin propanediol (FARXIGA) 10 MG TABS tablet Take 1 tablet (10 mg total) by mouth daily before breakfast.   ferrous sulfate 325 (65 FE) MG tablet Take 325 mg by mouth daily with breakfast.   furosemide (LASIX) 20 MG tablet Take 20 mg by mouth daily.   mesalamine (LIALDA) 1.2 g EC tablet Take 4.8 g by mouth every morning.   mexiletine (MEXITIL) 250 MG capsule TAKE 1 CAPSULE BY MOUTH 2 TIMES DAILY.   montelukast (SINGULAIR) 10 MG tablet Take 10 mg by mouth daily.   predniSONE (DELTASONE) 10 MG tablet Take 10 mg by mouth every morning.   valsartan (DIOVAN) 40 MG tablet Take 40 mg by mouth 2 (two) times daily.   Vericiguat 10 MG TABS Take 10 mg by mouth daily.   Zinc Oxide 10 % OINT Apply 1 application topically as needed (burning).   [DISCONTINUED] Vericiguat (VERQUVO) 5 MG TABS Take 1 tablet (5 mg total) by mouth daily.     Allergies:   Aleve [naproxen sodium], Entresto [sacubitril-valsartan], and Metoprolol   Social History   Socioeconomic History   Marital status: Married    Spouse name: Not on file   Number of  children: Not on file   Years of education: Not on file   Highest education level: Not on file  Occupational History   Occupation: Retired  Tobacco Use   Smoking status: Former    Packs/day: 2.50    Years: 46.00    Pack years: 115.00    Types: Cigarettes    Quit date: 05/23/1996    Years since quitting: 25.1   Smokeless tobacco: Never  Vaping Use   Vaping Use: Never used  Substance and Sexual Activity   Alcohol use: Yes    Alcohol/week: 3.0 standard drinks    Types: 3 Cans of beer per week   Drug use: No   Sexual activity: Not on file  Other Topics Concern   Not on file  Social History Narrative   Not on file  Social Determinants of Health   Financial Resource Strain: Not on file  Food Insecurity: Not on file  Transportation Needs: Not on file  Physical Activity: Not on file  Stress: Not on file  Social Connections: Not on file     Family History: The patient's family history includes Alzheimer's disease in his father; Dementia in his mother. ROS:   Please see the history of present illness.    All other systems reviewed and are negative.  EKGs/Labs/Other Studies Reviewed:    The following studies were reviewed today:   Recent Labs: 09/04/2020: Hemoglobin 12.6; Hemoglobin 12.9 04/01/2021: ALT 22; BUN 14; Creatinine, Ser 0.96; NT-Pro BNP 7,410; Potassium 3.6; Sodium 142  Recent Lipid Panel No results found for: CHOL, TRIG, HDL, CHOLHDL, VLDL, LDLCALC, LDLDIRECT  Physical Exam:    VS:  BP (!) 108/58 (BP Location: Left Arm)    Pulse 64    Ht 5\' 11"  (1.803 m)    Wt 199 lb (90.3 kg)    SpO2 95%    BMI 27.75 kg/m     Wt Readings from Last 3 Encounters:  07/08/21 199 lb (90.3 kg)  05/31/21 200 lb 6.4 oz (90.9 kg)  04/01/21 199 lb 9.6 oz (90.5 kg)     GEN:  Well nourished, well developed in no acute distress HEENT: Normal NECK: No JVD; No carotid bruits LYMPHATICS: No lymphadenopathy CARDIAC: RRR, no murmurs, rubs, gallops RESPIRATORY:  Clear to  auscultation without rales, wheezing or rhonchi  ABDOMEN: Soft, non-tender, non-distended MUSCULOSKELETAL:  No edema; No deformity  SKIN: Warm and dry NEUROLOGIC:  Alert and oriented x 3 PSYCHIATRIC:  Normal affect    Signed, Shirlee More, MD  07/08/2021 1:05 PM     [facility]

## 2021-07-15 ENCOUNTER — Telehealth: Payer: Self-pay | Admitting: Cardiology

## 2021-07-15 NOTE — Telephone Encounter (Signed)
Pt c/o medication issue:  1. Name of Medication: Vericiguat 10 MG TABS  2. How are you currently taking this medication (dosage and times per day)? As prescribed   3. Are you having a reaction (difficulty breathing--STAT)? No   4. What is your medication issue? Patient is calling stating the increase in this medication has not caused SOB to get any better. Reports it has not gotten any worse, but when it gets bad he uses his wifes oxygen tank for 15 mins and feels great after.

## 2021-07-15 NOTE — Telephone Encounter (Signed)
Left VM to call back 

## 2021-07-16 NOTE — Telephone Encounter (Signed)
While speaking with the pt he identified that he has not been taking his Lasix. Pt will start his Lasix today and if no improvement he will go to the ED. Pt verbalized understanding and had no additional questions.

## 2021-07-26 DIAGNOSIS — R0609 Other forms of dyspnea: Secondary | ICD-10-CM | POA: Diagnosis not present

## 2021-07-28 ENCOUNTER — Telehealth: Payer: Self-pay

## 2021-07-28 NOTE — Telephone Encounter (Signed)
Pt called with c/o difficulty walking on any kind of incline. States he has increased "cramping". He has been scheduled with studies and MD visit for next week. Pt is aware of this appt. ?

## 2021-08-04 ENCOUNTER — Ambulatory Visit (INDEPENDENT_AMBULATORY_CARE_PROVIDER_SITE_OTHER)
Admission: RE | Admit: 2021-08-04 | Discharge: 2021-08-04 | Disposition: A | Discharge status: Home / Self Care | Payer: Medicare Other | Source: Ambulatory Visit | Attending: Vascular Surgery | Admitting: Vascular Surgery

## 2021-08-04 ENCOUNTER — Other Ambulatory Visit: Payer: Self-pay

## 2021-08-04 ENCOUNTER — Ambulatory Visit (INDEPENDENT_AMBULATORY_CARE_PROVIDER_SITE_OTHER): Payer: Medicare Other | Admitting: Vascular Surgery

## 2021-08-04 ENCOUNTER — Ambulatory Visit (HOSPITAL_COMMUNITY)
Admission: RE | Admit: 2021-08-04 | Discharge: 2021-08-04 | Disposition: A | Discharge status: Home / Self Care | Payer: Medicare Other | Source: Ambulatory Visit | Attending: Vascular Surgery | Admitting: Vascular Surgery

## 2021-08-04 ENCOUNTER — Encounter: Payer: Self-pay | Admitting: Vascular Surgery

## 2021-08-04 VITALS — BP 115/82 | HR 85 | Temp 97.9°F | Resp 20 | Ht 71.0 in | Wt 199.0 lb

## 2021-08-04 DIAGNOSIS — I6529 Occlusion and stenosis of unspecified carotid artery: Secondary | ICD-10-CM | POA: Insufficient documentation

## 2021-08-04 DIAGNOSIS — I739 Peripheral vascular disease, unspecified: Secondary | ICD-10-CM | POA: Insufficient documentation

## 2021-08-04 NOTE — Progress Notes (Signed)
? ?Patient ID: Derek [name], male   DOB: 08/28/1940, 81 y.o.   MRN: [id number] ? ?Reason for Consult: Follow-up ?  ?Referred by Vertell Novak* ? ?Subjective:  ?   ?HPI: ? ?Derek [name] is a 81 y.o. male has a history of left common and external iliac stenting and a previous left femoral to popliteal artery bypass by Dr. Hart Rochester many years ago as well as a right common iliac artery stent.  He continues to play golf but has pain with walking up hills particular try to get to the green.  Other than that he does very well maintains his flexibility does not have any other issues other than cramping with walking up hills.  He denies any history of stroke, TIA or amaurosis.  Is a former smoker also has hypercholesterolemia.  He continues on Plavix and a statin daily. ? ?Past Medical History:  ?Diagnosis Date  ? Androgen deficiency   ? Arthritis   ? Atherosclerosis of native arteries of the extremities with intermittent claudication 02/04/2014  ? B12 deficiency   ? Claudication [facility])   ? COPD (chronic obstructive pulmonary disease) (HCC)   ? COPD GOLD II    ? Pfts 07/15/13:  FeV1 1.46 (56%)  59% ratio 68   RV/TLC 145%  dlco  59%> 66%    ? Degenerative arthritis   ? Essential hypertension   ? Hypertension   ? IBS (irritable bowel syndrome)   ? Obese   ? PAD (peripheral artery disease) (HCC)   ? Pulmonary nodule   ? Pure hypercholesterolemia   ? Ulcerative colitis (HCC)   ? ?Family History  ?Problem Relation Age of Onset  ? Alzheimer's disease Father   ? Dementia Mother   ? ?Past Surgical History:  ?Procedure Laterality Date  ? ABDOMINAL AORTOGRAM W/LOWER EXTREMITY N/A 10/30/2017  ? Procedure: ABDOMINAL AORTOGRAM W/LOWER EXTREMITY;  Surgeon: Maeola Harman, MD;  Location: [facility] INVASIVE CV LAB;  Service: Cardiovascular;  Laterality: N/A;  ? AORTA - ILIAC ARTERY BYPASS GRAFT    ? PERIPHERAL VASCULAR INTERVENTION  10/30/2017  ? Procedure: PERIPHERAL VASCULAR INTERVENTION;  Surgeon: Maeola Harman, MD;   Location: [facility] INVASIVE CV LAB;  Service: Cardiovascular;;  LT Iliac  ? RIGHT/LEFT HEART CATH AND CORONARY ANGIOGRAPHY N/A 09/04/2020  ? Procedure: RIGHT/LEFT HEART CATH AND CORONARY ANGIOGRAPHY;  Surgeon: Tonny Bollman, MD;  Location: [facility] INVASIVE CV LAB;  Service: Cardiovascular;  Laterality: N/A;  ? ? ?Short Social History:  ?Social History  ? ?Tobacco Use  ? Smoking status: Former  ?  Packs/day: 2.50  ?  Years: 46.00  ?  Pack years: 115.00  ?  Types: Cigarettes  ?  Quit date: 05/23/1996  ?  Years since quitting: 25.2  ? Smokeless tobacco: Never  ?Substance Use Topics  ? Alcohol use: Yes  ?  Alcohol/week: 3.0 standard drinks  ?  Types: 3 Cans of beer per week  ? ? ?Allergies  ?Allergen Reactions  ? Aleve [Naproxen Sodium]   ?  Due to blood pressure  ? Entresto [Sacubitril-Valsartan] Other (See Comments)  ?  Hypotensive   ? Metoprolol Other (See Comments)  ?  Bradycardia  ? ? ?Current Outpatient Medications  ?Medication Sig Dispense Refill  ? Ascorbic Acid (VITAMIN C) 1000 MG tablet Take 1,000 mg by mouth daily.    ? atorvastatin (LIPITOR) 10 MG tablet Take 10 mg by mouth at bedtime.  5  ? BREZTRI AEROSPHERE 160-9-4.8 MCG/ACT AERO Inhale 1 puff into the  lungs in the morning and at bedtime.    ? clopidogrel (PLAVIX) 75 MG tablet Take 75 mg by mouth daily.    ? Cyanocobalamin (B-12) 1000 MCG TABS Take 1 tablet by mouth daily.    ? dapagliflozin propanediol (FARXIGA) 10 MG TABS tablet Take 1 tablet (10 mg total) by mouth daily before breakfast. 90 tablet 3  ? ferrous sulfate 325 (65 FE) MG tablet Take 325 mg by mouth daily with breakfast.    ? furosemide (LASIX) 20 MG tablet Take 20 mg by mouth daily.    ? mesalamine (LIALDA) 1.2 g EC tablet Take 4.8 g by mouth every morning.    ? mexiletine (MEXITIL) 250 MG capsule TAKE 1 CAPSULE BY MOUTH 2 TIMES DAILY. 180 capsule 1  ? montelukast (SINGULAIR) 10 MG tablet Take 10 mg by mouth daily.    ? nitroGLYCERIN (NITROSTAT) 0.4 MG SL tablet Place 1 tablet (0.4 mg total) under the  tongue every 5 (five) minutes as needed. 30 tablet 3  ? predniSONE (DELTASONE) 10 MG tablet Take 10 mg by mouth every morning.    ? valsartan (DIOVAN) 40 MG tablet Take 40 mg by mouth 2 (two) times daily.    ? Vericiguat 10 MG TABS Take 10 mg by mouth daily. 90 tablet 3  ? Zinc Oxide 10 % OINT Apply 1 application topically as needed (burning).    ? ?No current facility-administered medications for this visit.  ? ? ?Review of Systems  ?Constitutional:  Constitutional negative. ?HENT: HENT negative.  ?Eyes: Eyes negative.  ?Respiratory: Respiratory negative.  ?Cardiovascular: Positive for claudication.  ?GI: Gastrointestinal negative.  ?Musculoskeletal: Musculoskeletal negative.  ?Skin: Skin negative.  ?Neurological: Neurological negative. ?Hematologic: Hematologic/lymphatic negative.  ?Psychiatric: Psychiatric negative.   ? ?   ?Objective:  ?Objective  ? ?Vitals:  ? 08/04/21 0855  ?BP: 115/82  ?Pulse: 85  ?Resp: 20  ?Temp: 97.9 ?F (36.6 ?C)  ?SpO2: 95%  ?Weight: 199 lb (90.3 kg)  ?Height: 5\' 11"  (1.803 m)  ? ?Body mass index is 27.75 kg/m?. ? ?Physical Exam ?HENT:  ?   Head: Normocephalic.  ?   Nose:  ?   Comments: Wearing a mask ?Neck:  ?   Vascular: No carotid bruit.  ?Cardiovascular:  ?   Pulses:     ?     Femoral pulses are 2+ on the right side and 2+ on the left side. ?     Popliteal pulses are 0 on the right side and 2+ on the left side.  ?Pulmonary:  ?   Effort: Pulmonary effort is normal.  ?Abdominal:  ?   General: Abdomen is flat.  ?Musculoskeletal:     ?   General: Normal range of motion.  ?   Right lower leg: No edema.  ?   Left lower leg: No edema.  ?Skin: ?   General: Skin is warm.  ?   Capillary Refill: Capillary refill takes less than 2 seconds.  ?Neurological:  ?   General: No focal deficit present.  ?   Mental Status: He is alert.  ?Psychiatric:     ?   Mood and Affect: Mood normal.     ?   Behavior: Behavior normal.     ?   Thought Content: Thought content normal.     ?   Judgment: Judgment normal.   ? ? ?Data: ?Right Carotid Findings:  ?+----------+--------+--------+--------+------------------+---------+  ?          PSV cm/sEDV cm/sStenosisPlaque DescriptionComments   ?+----------+--------+--------+--------+------------------+---------+  ?  CCA Prox  62      13                                           ?+----------+--------+--------+--------+------------------+---------+  ?CCA Mid   38      13              heterogenous                 ?+----------+--------+--------+--------+------------------+---------+  ?CCA Distal35      13              calcific                     ?+----------+--------+--------+--------+------------------+---------+  ?ICA Prox  173     49      40-59%  calcific          Shadowing  ?+----------+--------+--------+--------+------------------+---------+  ?ICA Mid   66      16                                           ?+----------+--------+--------+--------+------------------+---------+  ?ICA Distal64      18                                           ?+----------+--------+--------+--------+------------------+---------+  ?ECA       271                                                  ?+----------+--------+--------+--------+------------------+---------+  ? ?+----------+--------+-------+----------------+-------------------+  ?          PSV cm/sEDV cmsDescribe        Arm Pressure (mmHG)  ?+----------+--------+-------+----------------+-------------------+  ?[id number]             Multiphasic, WNL                     ?+----------+--------+-------+----------------+-------------------+  ? ?+---------+--------+--+--------+--+---------+  ?VertebralPSV cm/s39EDV cm/s12Antegrade  ?+---------+--------+--+--------+--+---------+  ? ?   ? ?Left Carotid Findings:  ?+----------+--------+--------+--------+------------------+---------+  ?          PSV cm/sEDV cm/sStenosisPlaque DescriptionComments    ?+----------+--------+--------+--------+------------------+---------+  ?CCA Prox  52      11                                           ?+----------+--------+--------+--------+------------------+---------+  ?CCA Mid   58      18              heterogenous                 ?+----------+--------+--------+--------+-------

## 2021-08-26 DIAGNOSIS — E785 Hyperlipidemia, unspecified: Secondary | ICD-10-CM | POA: Diagnosis not present

## 2021-08-26 DIAGNOSIS — Z87891 Personal history of nicotine dependence: Secondary | ICD-10-CM | POA: Diagnosis not present

## 2021-08-26 DIAGNOSIS — R0600 Dyspnea, unspecified: Secondary | ICD-10-CM | POA: Diagnosis not present

## 2021-08-26 DIAGNOSIS — I471 Supraventricular tachycardia: Secondary | ICD-10-CM | POA: Diagnosis not present

## 2021-08-26 DIAGNOSIS — I739 Peripheral vascular disease, unspecified: Secondary | ICD-10-CM | POA: Diagnosis not present

## 2021-08-26 DIAGNOSIS — I429 Cardiomyopathy, unspecified: Secondary | ICD-10-CM | POA: Diagnosis not present

## 2021-08-26 DIAGNOSIS — I2510 Atherosclerotic heart disease of native coronary artery without angina pectoris: Secondary | ICD-10-CM | POA: Diagnosis not present

## 2021-08-26 DIAGNOSIS — Z888 Allergy status to other drugs, medicaments and biological substances status: Secondary | ICD-10-CM | POA: Diagnosis not present

## 2021-08-26 DIAGNOSIS — I255 Ischemic cardiomyopathy: Secondary | ICD-10-CM | POA: Diagnosis not present

## 2021-08-26 DIAGNOSIS — I509 Heart failure, unspecified: Secondary | ICD-10-CM | POA: Diagnosis not present

## 2021-08-26 DIAGNOSIS — Z7902 Long term (current) use of antithrombotics/antiplatelets: Secondary | ICD-10-CM | POA: Diagnosis not present

## 2021-08-26 DIAGNOSIS — J9811 Atelectasis: Secondary | ICD-10-CM | POA: Diagnosis not present

## 2021-08-26 DIAGNOSIS — I5A Non-ischemic myocardial injury (non-traumatic): Secondary | ICD-10-CM | POA: Diagnosis not present

## 2021-08-26 DIAGNOSIS — I25118 Atherosclerotic heart disease of native coronary artery with other forms of angina pectoris: Secondary | ICD-10-CM | POA: Diagnosis not present

## 2021-08-26 DIAGNOSIS — J449 Chronic obstructive pulmonary disease, unspecified: Secondary | ICD-10-CM | POA: Diagnosis not present

## 2021-08-26 DIAGNOSIS — I5023 Acute on chronic systolic (congestive) heart failure: Secondary | ICD-10-CM | POA: Diagnosis not present

## 2021-08-26 DIAGNOSIS — I493 Ventricular premature depolarization: Secondary | ICD-10-CM | POA: Diagnosis not present

## 2021-08-26 DIAGNOSIS — Z79899 Other long term (current) drug therapy: Secondary | ICD-10-CM | POA: Diagnosis not present

## 2021-08-26 DIAGNOSIS — I350 Nonrheumatic aortic (valve) stenosis: Secondary | ICD-10-CM | POA: Diagnosis not present

## 2021-08-26 DIAGNOSIS — I110 Hypertensive heart disease with heart failure: Secondary | ICD-10-CM | POA: Diagnosis not present

## 2021-08-27 DIAGNOSIS — I350 Nonrheumatic aortic (valve) stenosis: Secondary | ICD-10-CM | POA: Diagnosis not present

## 2021-08-27 DIAGNOSIS — J449 Chronic obstructive pulmonary disease, unspecified: Secondary | ICD-10-CM | POA: Diagnosis not present

## 2021-08-27 DIAGNOSIS — I739 Peripheral vascular disease, unspecified: Secondary | ICD-10-CM | POA: Diagnosis not present

## 2021-08-27 DIAGNOSIS — I2510 Atherosclerotic heart disease of native coronary artery without angina pectoris: Secondary | ICD-10-CM | POA: Diagnosis not present

## 2021-08-27 DIAGNOSIS — I110 Hypertensive heart disease with heart failure: Secondary | ICD-10-CM | POA: Diagnosis not present

## 2021-08-27 DIAGNOSIS — I429 Cardiomyopathy, unspecified: Secondary | ICD-10-CM | POA: Diagnosis not present

## 2021-08-27 DIAGNOSIS — I493 Ventricular premature depolarization: Secondary | ICD-10-CM | POA: Diagnosis not present

## 2021-08-27 DIAGNOSIS — I471 Supraventricular tachycardia: Secondary | ICD-10-CM | POA: Diagnosis not present

## 2021-08-28 DIAGNOSIS — I493 Ventricular premature depolarization: Secondary | ICD-10-CM | POA: Diagnosis not present

## 2021-08-28 DIAGNOSIS — I110 Hypertensive heart disease with heart failure: Secondary | ICD-10-CM | POA: Diagnosis not present

## 2021-08-28 DIAGNOSIS — I2510 Atherosclerotic heart disease of native coronary artery without angina pectoris: Secondary | ICD-10-CM | POA: Diagnosis not present

## 2021-08-28 DIAGNOSIS — I350 Nonrheumatic aortic (valve) stenosis: Secondary | ICD-10-CM | POA: Diagnosis not present

## 2021-08-29 DIAGNOSIS — I2510 Atherosclerotic heart disease of native coronary artery without angina pectoris: Secondary | ICD-10-CM | POA: Diagnosis not present

## 2021-08-29 DIAGNOSIS — I110 Hypertensive heart disease with heart failure: Secondary | ICD-10-CM | POA: Diagnosis not present

## 2021-08-29 DIAGNOSIS — I493 Ventricular premature depolarization: Secondary | ICD-10-CM | POA: Diagnosis not present

## 2021-08-29 DIAGNOSIS — I350 Nonrheumatic aortic (valve) stenosis: Secondary | ICD-10-CM | POA: Diagnosis not present

## 2021-09-03 ENCOUNTER — Encounter: Payer: Self-pay | Admitting: Cardiology

## 2021-09-03 ENCOUNTER — Ambulatory Visit: Payer: Medicare Other | Admitting: Cardiology

## 2021-09-03 VITALS — BP 86/56 | HR 84 | Ht 71.0 in | Wt 194.0 lb

## 2021-09-03 DIAGNOSIS — I5022 Chronic systolic (congestive) heart failure: Secondary | ICD-10-CM

## 2021-09-03 DIAGNOSIS — I25118 Atherosclerotic heart disease of native coronary artery with other forms of angina pectoris: Secondary | ICD-10-CM

## 2021-09-03 DIAGNOSIS — J449 Chronic obstructive pulmonary disease, unspecified: Secondary | ICD-10-CM

## 2021-09-03 DIAGNOSIS — I350 Nonrheumatic aortic (valve) stenosis: Secondary | ICD-10-CM | POA: Diagnosis not present

## 2021-09-03 DIAGNOSIS — I255 Ischemic cardiomyopathy: Secondary | ICD-10-CM

## 2021-09-03 DIAGNOSIS — I493 Ventricular premature depolarization: Secondary | ICD-10-CM | POA: Diagnosis not present

## 2021-09-03 MED ORDER — TORSEMIDE 20 MG PO TABS: 20.0000 mg | ORAL_TABLET | Freq: Every day | ORAL | 3 refills | Status: DC

## 2021-09-03 MED ORDER — VERICIGUAT 2.5 MG PO TABS: 2.5000 mg | ORAL_TABLET | Freq: Every day | ORAL | 3 refills | Status: DC

## 2021-09-03 MED ORDER — VALSARTAN 40 MG PO TABS
40.0000 mg | ORAL_TABLET | Freq: Every day | ORAL | 3 refills | Status: DC
Start: 2021-09-03 — End: 2021-09-17

## 2021-09-03 NOTE — Progress Notes (Signed)
?Cardiology Office Note:   ? ?Date:  09/03/2021  ? ?ID:  Derek [name], DOB 10/14/1940, MRN [id number] ? ?PCP:  Ninfa Meeker, FNP  ?Cardiologist:  Norman Herrlich, MD   ? ?Referring MD: Vertell Novak*  ? ? ?ASSESSMENT:   ? ?1. Chronic systolic heart failure (HCC)   ?2. Ischemic cardiomyopathy   ?3. Coronary artery disease of native artery of native heart with stable angina pectoris (HCC)   ?4. PVC's (premature ventricular contractions)   ?5. COPD GOLD II    ?6. Mild aortic stenosis   ? ?PLAN:   ? ?In order of problems listed above: ? ?Is a very difficult situation with his hearing deficit family situation difficulty communicating not having access to a phone during the day is very complex to arrange his care he should have appointments with both EP and advanced heart failure and will try to write the times down for him today.  I think he is over diuresed hypotensive we will reduce his loop diuretic along with his ARB and I have asked him to check and record his weights and blood pressures at home.  He has been on maximally tolerated medical treatment including SGLT2 inhibitor loop diuretic MRA not on Entresto because of previous hypotension and intolerant to beta-blockers with his COPD ?Worsening cardiomyopathy very severe and be seen by EP and advanced heart failure ?Stable CAD continue medical treatment clopidogrel along with statin ?Continue his mexiletine should have an appointment with EP ?Significant contributor to his shortness of breath and chronic illness ?Stable on recent echocardiogram ?Next appointment: 4 weeks ? ? ?Medication Adjustments/Labs and Tests Ordered: ?Current medicines are reviewed at length with the patient today.  Concerns regarding medicines are outlined above.  ?No orders of the defined types were placed in this encounter. ? ?No orders of the defined types were placed in this encounter. ? ?Chief complaint follow-up for heart failure ? ? ?History of Present Illness:   ? ?Derek [name] is a 81 y.o. male with a hx of  very complex heart disease including CAD with chronic occlusion of the left circumflex coronary artery mild aortic stenosis frequent PVCs and APCs with brief episodes of SVT cardiomyopathy EF 30 to 35% hypertensive heart disease and peripheral arterial disease.  He has multiple mechanisms for shortness of breath including cardiomyopathy CAD underlying lung disease treated with steroids and bronchodilators.  He has been intolerant of both Entresto and beta-blocker.  He has been seen by EP for his PVC burden and decision was made to hold on ICD therapy and was initiated on mexiletine to suppress his PVCs last seen last week as an inpatient at [facility] after being admitted with decompensated heart failure and ejection fraction is now severely reduced less than 20%.  He has been referred to both advanced heart failure as well as back to EP for reconsideration of ICD therapy. ? ?Compliance with diet, lifestyle and medications: Yes  ? ?My is struggling ?He is progressively weak he is hypotensive today his weight is down in the range of 10 pounds ?Reduce his diuretic ARB and vericiquat  ?I have asked him to check his blood pressure daily and write down ?I tried to refer him to the heart function program but he has difficulty with hearing deficit and does not have a phone during the day I will think any arrangements have been made ?We will recheck his renal function proBNP today ?Past Medical History:  ?Diagnosis Date  ? Androgen  deficiency   ? Arthritis   ? Atherosclerosis of native arteries of the extremities with intermittent claudication 02/04/2014  ? B12 deficiency   ? Claudication [facility])   ? COPD (chronic obstructive pulmonary disease) (HCC)   ? COPD GOLD II    ? Pfts 07/15/13:  FeV1 1.46 (56%)  59% ratio 68   RV/TLC 145%  dlco  59%> 66%    ? Degenerative arthritis   ? Essential hypertension   ? Hypertension   ? IBS (irritable bowel syndrome)   ? Obese   ? PAD  (peripheral artery disease) (HCC)   ? Pulmonary nodule   ? Pure hypercholesterolemia   ? Ulcerative colitis (HCC)   ? ? ?Past Surgical History:  ?Procedure Laterality Date  ? ABDOMINAL AORTOGRAM W/LOWER EXTREMITY N/A 10/30/2017  ? Procedure: ABDOMINAL AORTOGRAM W/LOWER EXTREMITY;  Surgeon: Maeola Harman, MD;  Location: [facility] INVASIVE CV LAB;  Service: Cardiovascular;  Laterality: N/A;  ? AORTA - ILIAC ARTERY BYPASS GRAFT    ? PERIPHERAL VASCULAR INTERVENTION  10/30/2017  ? Procedure: PERIPHERAL VASCULAR INTERVENTION;  Surgeon: Maeola Harman, MD;  Location: [facility] INVASIVE CV LAB;  Service: Cardiovascular;;  LT Iliac  ? RIGHT/LEFT HEART CATH AND CORONARY ANGIOGRAPHY N/A 09/04/2020  ? Procedure: RIGHT/LEFT HEART CATH AND CORONARY ANGIOGRAPHY;  Surgeon: Tonny Bollman, MD;  Location: [facility] INVASIVE CV LAB;  Service: Cardiovascular;  Laterality: N/A;  ? ? ?Current Medications: ?Current Meds  ?Medication Sig  ? Ascorbic Acid (VITAMIN C) 1000 MG tablet Take 1,000 mg by mouth daily.  ? atorvastatin (LIPITOR) 10 MG tablet Take 10 mg by mouth at bedtime.  ? clopidogrel (PLAVIX) 75 MG tablet Take 75 mg by mouth daily.  ? Cyanocobalamin (B-12) 1000 MCG TABS Take 1 tablet by mouth daily.  ? dapagliflozin propanediol (FARXIGA) 10 MG TABS tablet Take 1 tablet (10 mg total) by mouth daily before breakfast.  ? ferrous sulfate 325 (65 FE) MG tablet Take 325 mg by mouth daily with breakfast.  ? KLOR-CON M20 20 MEQ tablet Take 20 mEq by mouth daily.  ? mesalamine (LIALDA) 1.2 g EC tablet Take 4.8 g by mouth every morning.  ? mexiletine (MEXITIL) 250 MG capsule TAKE 1 CAPSULE BY MOUTH 2 TIMES DAILY.  ? montelukast (SINGULAIR) 10 MG tablet Take 10 mg by mouth daily.  ? nitroGLYCERIN (NITROSTAT) 0.4 MG SL tablet Place 1 tablet (0.4 mg total) under the tongue every 5 (five) minutes as needed.  ? predniSONE (DELTASONE) 10 MG tablet Take 10 mg by mouth every morning.  ? spironolactone (ALDACTONE) 25 MG tablet Take 25 mg by mouth  daily.  ? torsemide (DEMADEX) 20 MG tablet Take 20 mg by mouth 2 (two) times daily.  ? valsartan (DIOVAN) 40 MG tablet Take 40 mg by mouth 2 (two) times daily.  ? Vericiguat 10 MG TABS Take 10 mg by mouth daily.  ? Zinc Oxide 10 % OINT Apply 1 application topically as needed (burning).  ?  ? ?Allergies:   Aleve [naproxen sodium], Entresto [sacubitril-valsartan], and Metoprolol  ? ?Social History  ? ?Socioeconomic History  ? Marital status: Married  ?  Spouse name: Not on file  ? Number of children: Not on file  ? Years of education: Not on file  ? Highest education level: Not on file  ?Occupational History  ? Occupation: Retired  ?Tobacco Use  ? Smoking status: Former  ?  Packs/day: 2.50  ?  Years: 46.00  ?  Pack years: 115.00  ?  Types: Cigarettes  ?  Quit date: 05/23/1996  ?  Years since quitting: 25.2  ?  Passive exposure: Past  ? Smokeless tobacco: Never  ?Vaping Use  ? Vaping Use: Never used  ?Substance and Sexual Activity  ? Alcohol use: Yes  ?  Alcohol/week: 3.0 standard drinks  ?  Types: 3 Cans of beer per week  ? Drug use: No  ? Sexual activity: Not on file  ?Other Topics Concern  ? Not on file  ?Social History Narrative  ? Not on file  ? ?Social Determinants of Health  ? ?Financial Resource Strain: Not on file  ?Food Insecurity: Not on file  ?Transportation Needs: Not on file  ?Physical Activity: Not on file  ?Stress: Not on file  ?Social Connections: Not on file  ?  ? ?Family History: ?The patient's family history includes Alzheimer's disease in his father; Dementia in his mother. ?ROS:   ?Please see the history of present illness.    ?All other systems reviewed and are negative. ? ?EKGs/Labs/Other Studies Reviewed:   ? ?The following studies were reviewed today: ? ? ?Recent Labs: ?09/04/2020: Hemoglobin 12.6; Hemoglobin 12.9 ?04/01/2021: ALT 22; BUN 14; Creatinine, Ser 0.96; NT-Pro BNP 7,410; Potassium 3.6; Sodium 142  ?Recent Lipid Panel ?No results found for: CHOL, TRIG, HDL, CHOLHDL, VLDL, LDLCALC,  LDLDIRECT ? ?Physical Exam:   ? ?VS:  BP (!) 86/56 (BP Location: Left Arm)   Pulse 84   Ht 5\' 11"  (1.803 m)   Wt 194 lb (88 kg)   SpO2 96%   BMI 27.06 kg/m?    ? ?Wt Readings from Last 3 Encounters:  ?09/03/21 194

## 2021-09-03 NOTE — Addendum Note (Signed)
Addended by: Roxanne Mins I on: 09/03/2021 10:15 AM ? ? Modules accepted: Orders ? ?

## 2021-09-03 NOTE — Patient Instructions (Addendum)
Medication Instructions:  ?Your physician has recommended you make the following change in your medication:  ? ?START: Vericiquat 2.5 mg daily ?START: Torsemide 20 mg daily (if weight 194 or greater take a second Torsemide in the evening ?START: Valsartan 40 mg daily ? ? ?*If you need a refill on your cardiac medications before your next appointment, please call your pharmacy* ? ? ?Lab Work: ?Your physician recommends that you return for lab work in:  ? ?Labs today: BMP, Pro BNP ? ?If you have labs (blood work) drawn today and your tests are completely normal, you will receive your results only by: ?MyChart Message (if you have MyChart) OR ?A paper copy in the mail ?If you have any lab test that is abnormal or we need to change your treatment, we will call you to review the results. ? ? ?Testing/Procedures: ?None ? ? ?Follow-Up: ?At [facility], you and your health needs are our priority.  As part of our continuing mission to provide you with exceptional heart care, we have created designated Provider Care Teams.  These Care Teams include your primary Cardiologist (physician) and Advanced Practice Providers (APPs -  Physician Assistants and Nurse Practitioners) who all work together to provide you with the care you need, when you need it. ? ?We recommend signing up for the patient portal called "MyChart".  Sign up information is provided on this After Visit Summary.  MyChart is used to connect with patients for Virtual Visits (Telemedicine).  Patients are able to view lab/test results, encounter notes, upcoming appointments, etc.  Non-urgent messages can be sent to your provider as well.   ?To learn more about what you can do with MyChart, go to [email or web address].   ? ?Your next appointment:   ?6 week(s) ? ?The format for your next appointment:   ?In Person ? ?Provider:   ?Norman Herrlich, MD  ? ? ?Other Instructions ? ?[place] Appointment - 09/17/21 2:40 pm ?[place] Appointment - 09/20/21 11:00  am ? ? ?Important Information About Sugar ? ? ? ? ? ? ?

## 2021-09-04 LAB — PRO B NATRIURETIC PEPTIDE: NT-Pro BNP: 3969 pg/mL — ABNORMAL HIGH (ref 0–486)

## 2021-09-04 LAB — BASIC METABOLIC PANEL
BUN/Creatinine Ratio: 21 (ref 10–24)
BUN: 30 mg/dL — ABNORMAL HIGH (ref 8–27)
CO2: 24 mmol/L (ref 20–29)
Calcium: 10 mg/dL (ref 8.6–10.2)
Chloride: 94 mmol/L — ABNORMAL LOW (ref 96–106)
Creatinine, Ser: 1.43 mg/dL — ABNORMAL HIGH (ref 0.76–1.27)
Glucose: 104 mg/dL — ABNORMAL HIGH (ref 70–99)
Potassium: 5 mmol/L (ref 3.5–5.2)
Sodium: 135 mmol/L (ref 134–144)
eGFR: 49 mL/min/{1.73_m2} — ABNORMAL LOW (ref >59)

## 2021-09-06 ENCOUNTER — Telehealth: Payer: Self-pay | Admitting: *Deleted

## 2021-09-06 DIAGNOSIS — I5022 Chronic systolic (congestive) heart failure: Secondary | ICD-10-CM

## 2021-09-06 DIAGNOSIS — Z79899 Other long term (current) drug therapy: Secondary | ICD-10-CM

## 2021-09-06 NOTE — Telephone Encounter (Signed)
-----   Message from Baldo Daub, MD sent at 09/04/2021  9:23 PM EDT ----- ?I decreased his diuretic, recheck BMP 1 week ?

## 2021-09-06 NOTE — Telephone Encounter (Signed)
Let pt know to come back in 1 week to recheck a BMP since Dr. Dulce Sellar reduced his Torsemide to once daily. Pt is agreeable to plan. ?

## 2021-09-09 ENCOUNTER — Telehealth: Payer: Self-pay | Admitting: Cardiology

## 2021-09-09 NOTE — Telephone Encounter (Signed)
Left vm for pt to callback 

## 2021-09-09 NOTE — Telephone Encounter (Signed)
Patient called in to say that he can barely walk and feels  extremely week. Please advise  ?

## 2021-09-10 ENCOUNTER — Ambulatory Visit (INDEPENDENT_AMBULATORY_CARE_PROVIDER_SITE_OTHER): Payer: Medicare Other | Admitting: Cardiology

## 2021-09-10 VITALS — BP 62/40 | HR 100 | Ht 71.0 in | Wt 194.0 lb

## 2021-09-10 DIAGNOSIS — E860 Dehydration: Secondary | ICD-10-CM | POA: Diagnosis not present

## 2021-09-10 DIAGNOSIS — Z79899 Other long term (current) drug therapy: Secondary | ICD-10-CM | POA: Diagnosis not present

## 2021-09-10 DIAGNOSIS — R0600 Dyspnea, unspecified: Secondary | ICD-10-CM | POA: Diagnosis not present

## 2021-09-10 DIAGNOSIS — Z8679 Personal history of other diseases of the circulatory system: Secondary | ICD-10-CM | POA: Diagnosis not present

## 2021-09-10 DIAGNOSIS — J984 Other disorders of lung: Secondary | ICD-10-CM | POA: Diagnosis not present

## 2021-09-10 DIAGNOSIS — R6889 Other general symptoms and signs: Secondary | ICD-10-CM | POA: Diagnosis not present

## 2021-09-10 DIAGNOSIS — R404 Transient alteration of awareness: Secondary | ICD-10-CM | POA: Diagnosis not present

## 2021-09-10 DIAGNOSIS — R0609 Other forms of dyspnea: Secondary | ICD-10-CM | POA: Diagnosis not present

## 2021-09-10 DIAGNOSIS — I959 Hypotension, unspecified: Secondary | ICD-10-CM | POA: Diagnosis not present

## 2021-09-10 DIAGNOSIS — I5022 Chronic systolic (congestive) heart failure: Secondary | ICD-10-CM | POA: Diagnosis not present

## 2021-09-10 DIAGNOSIS — I5023 Acute on chronic systolic (congestive) heart failure: Secondary | ICD-10-CM | POA: Diagnosis not present

## 2021-09-10 DIAGNOSIS — I110 Hypertensive heart disease with heart failure: Secondary | ICD-10-CM | POA: Diagnosis not present

## 2021-09-10 DIAGNOSIS — Z743 Need for continuous supervision: Secondary | ICD-10-CM | POA: Diagnosis not present

## 2021-09-10 DIAGNOSIS — R5383 Other fatigue: Secondary | ICD-10-CM | POA: Diagnosis not present

## 2021-09-10 NOTE — Progress Notes (Signed)
? ?  Nurse Visit  ? ?Date of Encounter: 09/10/2021 ?ID: Derek [name], DOB February 06, 1941, MRN [id number] ? ?PCP:  Ninfa Meeker, FNP ?  ?[facility] Providers ?Cardiologist:  None ?Electrophysiologist:  Will Jorja Loa, MD    ? ? ?Visit Details  ? ?VS:  BP (!) 62/40   Pulse 100   SpO2 95%  , BMI There is no height or weight on file to calculate BMI. ? ?Wt Readings from Last 3 Encounters:  ?09/03/21 194 lb (88 kg)  ?08/04/21 199 lb (90.3 kg)  ?07/08/21 199 lb (90.3 kg)  ?  ? ?Reason for visit: Short of breath, blood pressure low. ?Performed today: Vitals, Blood work, Dr. Dulce Sellar consulted ?Changes (medications, testing, etc.) : Advised to stop Vericiguat and follow up with scheduled appts. BP checked and was 60/42 then 58/40. EMS called per Dr. Dulce Sellar and sent to [facility]. ?Length of Visit: 25 minutes ? ? ? ?Medications Adjustments/Labs and Tests Ordered: ?No orders of the defined types were placed in this encounter. ? ?No orders of the defined types were placed in this encounter. ? ? ? ?Signed, ?Neena Rhymes, RN  ?09/10/2021 12:43 PM  ?

## 2021-09-11 DIAGNOSIS — I110 Hypertensive heart disease with heart failure: Secondary | ICD-10-CM | POA: Diagnosis not present

## 2021-09-11 DIAGNOSIS — Z79899 Other long term (current) drug therapy: Secondary | ICD-10-CM | POA: Diagnosis not present

## 2021-09-11 DIAGNOSIS — I5022 Chronic systolic (congestive) heart failure: Secondary | ICD-10-CM | POA: Diagnosis not present

## 2021-09-11 DIAGNOSIS — J449 Chronic obstructive pulmonary disease, unspecified: Secondary | ICD-10-CM | POA: Diagnosis not present

## 2021-09-11 DIAGNOSIS — Z7902 Long term (current) use of antithrombotics/antiplatelets: Secondary | ICD-10-CM | POA: Diagnosis not present

## 2021-09-11 DIAGNOSIS — I959 Hypotension, unspecified: Secondary | ICD-10-CM | POA: Diagnosis not present

## 2021-09-11 DIAGNOSIS — Z87891 Personal history of nicotine dependence: Secondary | ICD-10-CM | POA: Diagnosis not present

## 2021-09-11 DIAGNOSIS — J984 Other disorders of lung: Secondary | ICD-10-CM | POA: Diagnosis not present

## 2021-09-11 DIAGNOSIS — I491 Atrial premature depolarization: Secondary | ICD-10-CM | POA: Diagnosis not present

## 2021-09-11 DIAGNOSIS — R5383 Other fatigue: Secondary | ICD-10-CM | POA: Diagnosis not present

## 2021-09-11 DIAGNOSIS — R531 Weakness: Secondary | ICD-10-CM | POA: Diagnosis not present

## 2021-09-11 DIAGNOSIS — Z8679 Personal history of other diseases of the circulatory system: Secondary | ICD-10-CM | POA: Diagnosis not present

## 2021-09-11 DIAGNOSIS — E860 Dehydration: Secondary | ICD-10-CM | POA: Diagnosis not present

## 2021-09-11 DIAGNOSIS — Z888 Allergy status to other drugs, medicaments and biological substances status: Secondary | ICD-10-CM | POA: Diagnosis not present

## 2021-09-11 DIAGNOSIS — R0600 Dyspnea, unspecified: Secondary | ICD-10-CM | POA: Diagnosis not present

## 2021-09-11 DIAGNOSIS — I5023 Acute on chronic systolic (congestive) heart failure: Secondary | ICD-10-CM | POA: Diagnosis not present

## 2021-09-11 LAB — BASIC METABOLIC PANEL
BUN/Creatinine Ratio: 23 (ref 10–24)
BUN: 30 mg/dL — ABNORMAL HIGH (ref 8–27)
CO2: 18 mmol/L — ABNORMAL LOW (ref 20–29)
Calcium: 10.2 mg/dL (ref 8.6–10.2)
Chloride: 100 mmol/L (ref 96–106)
Creatinine, Ser: 1.33 mg/dL — ABNORMAL HIGH (ref 0.76–1.27)
Glucose: 98 mg/dL (ref 70–99)
Potassium: 5.6 mmol/L — ABNORMAL HIGH (ref 3.5–5.2)
Sodium: 137 mmol/L (ref 134–144)
eGFR: 53 mL/min/{1.73_m2} — ABNORMAL LOW (ref >59)

## 2021-09-14 ENCOUNTER — Telehealth: Payer: Self-pay | Admitting: Cardiology

## 2021-09-14 ENCOUNTER — Encounter: Payer: Self-pay | Admitting: Cardiology

## 2021-09-14 DIAGNOSIS — I493 Ventricular premature depolarization: Secondary | ICD-10-CM

## 2021-09-14 DIAGNOSIS — Z7951 Long term (current) use of inhaled steroids: Secondary | ICD-10-CM | POA: Diagnosis not present

## 2021-09-14 DIAGNOSIS — Z7984 Long term (current) use of oral hypoglycemic drugs: Secondary | ICD-10-CM | POA: Diagnosis not present

## 2021-09-14 DIAGNOSIS — M2550 Pain in unspecified joint: Secondary | ICD-10-CM

## 2021-09-14 DIAGNOSIS — I509 Heart failure, unspecified: Secondary | ICD-10-CM | POA: Insufficient documentation

## 2021-09-14 DIAGNOSIS — R531 Weakness: Secondary | ICD-10-CM

## 2021-09-14 DIAGNOSIS — K5730 Diverticulosis of large intestine without perforation or abscess without bleeding: Secondary | ICD-10-CM

## 2021-09-14 DIAGNOSIS — I110 Hypertensive heart disease with heart failure: Secondary | ICD-10-CM | POA: Diagnosis not present

## 2021-09-14 DIAGNOSIS — R112 Nausea with vomiting, unspecified: Secondary | ICD-10-CM | POA: Insufficient documentation

## 2021-09-14 DIAGNOSIS — J9621 Acute and chronic respiratory failure with hypoxia: Secondary | ICD-10-CM

## 2021-09-14 DIAGNOSIS — I5022 Chronic systolic (congestive) heart failure: Secondary | ICD-10-CM | POA: Diagnosis not present

## 2021-09-14 DIAGNOSIS — I959 Hypotension, unspecified: Secondary | ICD-10-CM | POA: Diagnosis not present

## 2021-09-14 DIAGNOSIS — J180 Bronchopneumonia, unspecified organism: Secondary | ICD-10-CM

## 2021-09-14 DIAGNOSIS — R109 Unspecified abdominal pain: Secondary | ICD-10-CM

## 2021-09-14 DIAGNOSIS — Z7902 Long term (current) use of antithrombotics/antiplatelets: Secondary | ICD-10-CM | POA: Diagnosis not present

## 2021-09-14 DIAGNOSIS — E872 Acidosis, unspecified: Secondary | ICD-10-CM | POA: Insufficient documentation

## 2021-09-14 DIAGNOSIS — R001 Bradycardia, unspecified: Secondary | ICD-10-CM

## 2021-09-14 DIAGNOSIS — E860 Dehydration: Secondary | ICD-10-CM

## 2021-09-14 DIAGNOSIS — I5023 Acute on chronic systolic (congestive) heart failure: Secondary | ICD-10-CM

## 2021-09-14 DIAGNOSIS — Z87891 Personal history of nicotine dependence: Secondary | ICD-10-CM | POA: Diagnosis not present

## 2021-09-14 DIAGNOSIS — I4719 Other supraventricular tachycardia: Secondary | ICD-10-CM

## 2021-09-14 DIAGNOSIS — R778 Other specified abnormalities of plasma proteins: Secondary | ICD-10-CM

## 2021-09-14 DIAGNOSIS — E7800 Pure hypercholesterolemia, unspecified: Secondary | ICD-10-CM | POA: Diagnosis not present

## 2021-09-14 DIAGNOSIS — I471 Supraventricular tachycardia: Secondary | ICD-10-CM

## 2021-09-14 DIAGNOSIS — I2510 Atherosclerotic heart disease of native coronary artery without angina pectoris: Secondary | ICD-10-CM

## 2021-09-14 DIAGNOSIS — J449 Chronic obstructive pulmonary disease, unspecified: Secondary | ICD-10-CM | POA: Diagnosis not present

## 2021-09-14 DIAGNOSIS — R7989 Other specified abnormal findings of blood chemistry: Secondary | ICD-10-CM

## 2021-09-14 HISTORY — DX: Dehydration: E86.0

## 2021-09-14 HISTORY — DX: Acidosis, unspecified: E87.20

## 2021-09-14 HISTORY — DX: Hypertensive heart disease with heart failure: I50.23

## 2021-09-14 HISTORY — DX: Other supraventricular tachycardia: I47.19

## 2021-09-14 HISTORY — DX: Atherosclerotic heart disease of native coronary artery without angina pectoris: I25.10

## 2021-09-14 HISTORY — DX: Unspecified abdominal pain: R10.9

## 2021-09-14 HISTORY — DX: Pain in unspecified joint: M25.50

## 2021-09-14 HISTORY — DX: Diverticulosis of large intestine without perforation or abscess without bleeding: K57.30

## 2021-09-14 HISTORY — DX: Bradycardia, unspecified: R00.1

## 2021-09-14 HISTORY — DX: Ventricular premature depolarization: I49.3

## 2021-09-14 HISTORY — DX: Other specified abnormal findings of blood chemistry: R79.89

## 2021-09-14 HISTORY — DX: Heart failure, unspecified: I50.9

## 2021-09-14 HISTORY — DX: Bronchopneumonia, unspecified organism: J18.0

## 2021-09-14 HISTORY — DX: Hypertensive heart disease with heart failure: I11.0

## 2021-09-14 HISTORY — DX: Acute and chronic respiratory failure with hypoxia: J96.21

## 2021-09-14 HISTORY — DX: Supraventricular tachycardia: I47.1

## 2021-09-14 HISTORY — DX: Nausea with vomiting, unspecified: R11.2

## 2021-09-14 HISTORY — DX: Other specified abnormalities of plasma proteins: R77.8

## 2021-09-14 HISTORY — DX: Weakness: R53.1

## 2021-09-14 NOTE — Telephone Encounter (Signed)
Pt's home health nurse states that she would like an order from Dr. Dulce Sellar for pt's Vital Signs, to notify provider if HR gets below 40. Please advise ?

## 2021-09-15 NOTE — Telephone Encounter (Signed)
Spoke with Tiffany at [facility]. Dr. Dulce Sellar stated that he wants pt to see the Dr in the specialty heart care program on Friday and does not want to order anything at this time.  ?

## 2021-09-17 ENCOUNTER — Ambulatory Visit (HOSPITAL_COMMUNITY)
Admission: RE | Admit: 2021-09-17 | Discharge: 2021-09-17 | Disposition: A | Discharge status: Home / Self Care | Payer: Medicare Other | Source: Ambulatory Visit | Attending: Internal Medicine | Admitting: Internal Medicine

## 2021-09-17 ENCOUNTER — Other Ambulatory Visit (HOSPITAL_COMMUNITY): Payer: Self-pay | Admitting: *Deleted

## 2021-09-17 ENCOUNTER — Encounter (HOSPITAL_COMMUNITY): Payer: Self-pay | Admitting: Internal Medicine

## 2021-09-17 ENCOUNTER — Other Ambulatory Visit (HOSPITAL_COMMUNITY): Payer: Self-pay | Admitting: Internal Medicine

## 2021-09-17 VITALS — BP 82/56 | HR 92 | Wt 192.8 lb

## 2021-09-17 DIAGNOSIS — I959 Hypotension, unspecified: Secondary | ICD-10-CM | POA: Diagnosis not present

## 2021-09-17 DIAGNOSIS — I739 Peripheral vascular disease, unspecified: Secondary | ICD-10-CM | POA: Diagnosis not present

## 2021-09-17 DIAGNOSIS — Z7982 Long term (current) use of aspirin: Secondary | ICD-10-CM | POA: Diagnosis not present

## 2021-09-17 DIAGNOSIS — Z7984 Long term (current) use of oral hypoglycemic drugs: Secondary | ICD-10-CM | POA: Diagnosis not present

## 2021-09-17 DIAGNOSIS — I255 Ischemic cardiomyopathy: Secondary | ICD-10-CM | POA: Diagnosis not present

## 2021-09-17 DIAGNOSIS — Z79899 Other long term (current) drug therapy: Secondary | ICD-10-CM | POA: Diagnosis not present

## 2021-09-17 DIAGNOSIS — I493 Ventricular premature depolarization: Secondary | ICD-10-CM | POA: Insufficient documentation

## 2021-09-17 DIAGNOSIS — I428 Other cardiomyopathies: Secondary | ICD-10-CM | POA: Insufficient documentation

## 2021-09-17 DIAGNOSIS — M1990 Unspecified osteoarthritis, unspecified site: Secondary | ICD-10-CM | POA: Insufficient documentation

## 2021-09-17 DIAGNOSIS — I491 Atrial premature depolarization: Secondary | ICD-10-CM | POA: Diagnosis not present

## 2021-09-17 DIAGNOSIS — I110 Hypertensive heart disease with heart failure: Secondary | ICD-10-CM | POA: Insufficient documentation

## 2021-09-17 DIAGNOSIS — R9431 Abnormal electrocardiogram [ECG] [EKG]: Secondary | ICD-10-CM | POA: Diagnosis not present

## 2021-09-17 DIAGNOSIS — I2510 Atherosclerotic heart disease of native coronary artery without angina pectoris: Secondary | ICD-10-CM | POA: Insufficient documentation

## 2021-09-17 DIAGNOSIS — I5022 Chronic systolic (congestive) heart failure: Secondary | ICD-10-CM | POA: Diagnosis not present

## 2021-09-17 DIAGNOSIS — M2550 Pain in unspecified joint: Secondary | ICD-10-CM | POA: Diagnosis not present

## 2021-09-17 DIAGNOSIS — Z7902 Long term (current) use of antithrombotics/antiplatelets: Secondary | ICD-10-CM | POA: Diagnosis not present

## 2021-09-17 DIAGNOSIS — I472 Ventricular tachycardia, unspecified: Secondary | ICD-10-CM | POA: Insufficient documentation

## 2021-09-17 DIAGNOSIS — E669 Obesity, unspecified: Secondary | ICD-10-CM | POA: Insufficient documentation

## 2021-09-17 DIAGNOSIS — J449 Chronic obstructive pulmonary disease, unspecified: Secondary | ICD-10-CM | POA: Insufficient documentation

## 2021-09-17 DIAGNOSIS — H918X9 Other specified hearing loss, unspecified ear: Secondary | ICD-10-CM | POA: Diagnosis not present

## 2021-09-17 MED ORDER — TORSEMIDE 20 MG PO TABS: 20.0000 mg | ORAL_TABLET | Freq: Every day | ORAL | 3 refills | Status: AC | PRN

## 2021-09-17 NOTE — Progress Notes (Signed)
Zio patch placed onto patient.  All instructions and information reviewed with patient, they verbalize understanding with no questions. 

## 2021-09-17 NOTE — Patient Instructions (Signed)
Medication Changes: ? ?STOP Spironolactone  ? ?TAKE TORSEMIDE ONLY AS NEEDED ? ?Lab Work: ? ?none ? ?Testing/Procedures: ? ?Your provider has recommended that  you wear a Zio Patch for 14 days.  This monitor will record your heart rhythm for our review.  IF you have any symptoms while wearing the monitor please press the button.  If you have any issues with the patch or you notice a red or orange light on it please call the company at [phone].  Once you remove the patch please mail it back to the company as soon as possible so we can get the results. ? ?Your provider has recommended that you have a home sleep study.  We have provided you with the equipment in our office today. Please download the app and follow the instructions. YOUR PIN NUMBER IS: [id number]. Once you have completed the test you just dispose of the equipment, the information is automatically uploaded to [place] via blue-tooth technology. If your test is positive for sleep apnea and you need a home CPAP machine you will be contacted by Dr Norris Cross office [facility]) to set this up. ? ?Your physician has requested that you have a cardiac MRI. Cardiac MRI uses a computer to create images of your heart as its beating, producing both still and moving pictures of your heart and major blood vessels. For further information please visit [email or web address]. Please follow the instruction sheet given to you today for more information. ONCE APPROVED BY YOUR INSURANCE WE WILL CALL YOU TO SCHEDULE ? ? ?Referrals: ? ?None ? ?Special Instructions // Education: ? ?Do the following things EVERYDAY: ?Weigh yourself in the morning before breakfast. Write it down and keep it in a log. ?Take your medicines as prescribed ?Eat low salt foods--Limit salt (sodium) to 2000 mg per day.  ?Stay as active as you can everyday ?Limit all fluids for the day to less than 2 liters ? ? ?Follow-Up in: 4 weeks ? ?At the [facility], you and your health needs are our  priority. We have a designated team specialized in the treatment of Heart Failure. This Care Team includes your primary Heart Failure Specialized Cardiologist (physician), Advanced Practice Providers (APPs- Physician Assistants and Nurse Practitioners), and Pharmacist who all work together to provide you with the care you need, when you need it.  ? ?You may see any of the following providers on your designated Care Team at your next follow up: ? ?Dr Arvilla Meres ?Dr Marca Ancona ?Tonye Becket, NP ?Robbie Lis, PA ?Jessica Milford,NP ?Anna Genre, PA ?Karle Plumber, PharmD ? ? ?Please be sure to bring in all your medications bottles to every appointment.  ? ?Need to Contact us: ? ?If you have any questions or concerns before your next appointment please send [place] a message through [place] or call our office at [phone].   ? ?TO LEAVE A MESSAGE FOR THE NURSE SELECT OPTION 2, PLEASE LEAVE A MESSAGE INCLUDING: ?YOUR NAME ?DATE OF BIRTH ?CALL BACK NUMBER ?REASON FOR CALL**this is important as we prioritize the call backs ? ?YOU WILL RECEIVE A CALL BACK THE SAME DAY AS LONG AS YOU CALL BEFORE 4:00 PM ? ? ?

## 2021-09-17 NOTE — Progress Notes (Signed)
? ?ADVANCED HF CLINIC CONSULT NOTE ? ?Referring Physician: Dr. Bettina Gavia ?Primary Care: Leilani Able, FNP ?Primary Cardiologist: Dr. Bettina Gavia ? ?HPI: ? ?Derek [name] is an 81 y/o obese male with COPD, HTN, PAD with previous LE revascularization, CAD, severe hearing loss frequent PVCs and systolic HF. Referred by Dr. Bettina Gavia for further evaluation of his systolic HF ? ?Echo 12/21 EF 30-35% ? ?Cath 4/22 EF 35-45% Single vessel CAD with chronic occlusion of dominant LCx. Mild AS and normal RH pressures. Felt to have possible PVC cardiomyopathy ? ?Zio 5/22: Multiple runs NSVT. 10% PVCs 9.6% PACs ? ?Admitted to [facility] several weeks ago with marked volume overload. Readmitted a week or two later with volume depletion and SBP 60s. Valsartan stopped.  ? ?Echo 4/23 EF < 20% RV moderately down. Mild to moderate AS ? ?Here with his granddaughter who is an Therapist, sports. Wife currently home on hospice. Over the last 8 months had marked decrease in functional capacity. Has been very active prior to that playing golf, etc. Gets SOB and weak after 100 feet. No edema, orthopnea or PND ? ? ?Review of Systems: [y] = yes, [ ]  = no  ? ?General: Weight gain [ ] ; Weight loss [ ] ; Anorexia [ ] ; Fatigue [ ] ; Fever [ ] ; Chills [ ] ; Weakness Blue.Reese ]  ?Cardiac: Chest pain/pressure [ ] ; Resting SOB [ ] ; Exertional SOB Blue.Reese ]; Orthopnea [ ] ; Pedal Edema [ ] ; Palpitations [ ] ; Syncope [ ] ; Presyncope [ ] ; Paroxysmal nocturnal dyspnea[ ]   ?Pulmonary: Cough [ ] ; Wheezing[ ] ; Hemoptysis[ ] ; Sputum [ ] ; Snoring [ ]   ?GI: Vomiting[ ] ; Dysphagia[ ] ; Melena[ ] ; Hematochezia [ ] ; Heartburn[ ] ; Abdominal pain [ ] ; Constipation [ ] ; Diarrhea [ ] ; BRBPR [ ]   ?GU: Hematuria[ ] ; Dysuria [ ] ; Nocturia[ ]   ?Vascular: Pain in legs with walking Blue.Reese ]; Pain in feet with lying flat [ ] ; Non-healing sores [ ] ; Stroke [ ] ; TIA [ ] ; Slurred speech [ ] ;  ?Neuro: Headaches[ ] ; Vertigo[ ] ; Seizures[ ] ; Paresthesias[ ] ;Blurred vision [ ] ; Diplopia [ ] ; Vision changes [ ]   ?Ortho/Skin: Arthritis [  y]; Joint pain [ y]; Muscle pain [ ] ; Joint swelling [ ] ; Back Pain [ ] ; Rash [ ]   ?Psych: Depression[ ] ; Anxiety[ ]   ?Heme: Bleeding problems [ ] ; Clotting disorders [ ] ; Anemia [ ]   ?Endocrine: Diabetes [ ] ; Thyroid dysfunction[ ]  ? ? ?Past Medical History:  ?Diagnosis Date  ? Androgen deficiency   ? Arthritis   ? Atherosclerosis of native arteries of the extremities with intermittent claudication 02/04/2014  ? B12 deficiency   ? Claudication [facility])   ? COPD (chronic obstructive pulmonary disease) ([place])   ? COPD GOLD II    ? Pfts 07/15/13:  FeV1 1.46 (56%)  59% ratio 68   RV/TLC 145%  dlco  59%> 66%    ? Degenerative arthritis   ? Essential hypertension   ? Hypertension   ? IBS (irritable bowel syndrome)   ? Obese   ? PAD (peripheral artery disease) ([place])   ? Pulmonary nodule   ? Pure hypercholesterolemia   ? Ulcerative colitis ([place])   ? ? ?Current Outpatient Medications  ?Medication Sig Dispense Refill  ? Ascorbic Acid (VITAMIN C) 1000 MG tablet Take 1,000 mg by mouth daily.    ? atorvastatin (LIPITOR) 10 MG tablet Take 10 mg by mouth at bedtime.  5  ? clopidogrel (PLAVIX) 75 MG tablet Take 75 mg by mouth daily.    ?  Cyanocobalamin (B-12) 1000 MCG TABS Take 1 tablet by mouth daily.    ? dapagliflozin propanediol (FARXIGA) 10 MG TABS tablet Take 1 tablet (10 mg total) by mouth daily before breakfast. 90 tablet 3  ? ferrous sulfate 325 (65 FE) MG tablet Take 325 mg by mouth daily with breakfast.    ? mesalamine (LIALDA) 1.2 g EC tablet Take 4.8 g by mouth every morning.    ? mexiletine (MEXITIL) 250 MG capsule TAKE 1 CAPSULE BY MOUTH 2 TIMES DAILY. 180 capsule 1  ? nitroGLYCERIN (NITROSTAT) 0.4 MG SL tablet Place 1 tablet (0.4 mg total) under the tongue every 5 (five) minutes as needed. 30 tablet 3  ? Zinc Oxide 10 % OINT Apply 1 application topically as needed (burning).    ? torsemide (DEMADEX) 20 MG tablet Take 1 tablet (20 mg total) by mouth daily as needed. If weight is 194 or greater 90 tablet 3  ? ?No current  facility-administered medications for this encounter.  ? ? ?Allergies  ?Allergen Reactions  ? Aleve [Naproxen Sodium]   ?  Due to blood pressure  ? Entresto [Sacubitril-Valsartan] Other (See Comments)  ?  Hypotensive   ? Metoprolol Other (See Comments)  ?  Bradycardia  ? ? ?  ?Social History  ? ?Socioeconomic History  ? Marital status: Married  ?  Spouse name: Not on file  ? Number of children: Not on file  ? Years of education: Not on file  ? Highest education level: Not on file  ?Occupational History  ? Occupation: Retired  ?Tobacco Use  ? Smoking status: Former  ?  Packs/day: 2.50  ?  Years: 46.00  ?  Pack years: 115.00  ?  Types: Cigarettes  ?  Quit date: 05/23/1996  ?  Years since quitting: 25.3  ?  Passive exposure: Past  ? Smokeless tobacco: Never  ?Vaping Use  ? Vaping Use: Never used  ?Substance and Sexual Activity  ? Alcohol use: Yes  ?  Alcohol/week: 3.0 standard drinks  ?  Types: 3 Cans of beer per week  ? Drug use: No  ? Sexual activity: Not on file  ?Other Topics Concern  ? Not on file  ?Social History Narrative  ? Not on file  ? ?Social Determinants of Health  ? ?Financial Resource Strain: Not on file  ?Food Insecurity: Not on file  ?Transportation Needs: Not on file  ?Physical Activity: Not on file  ?Stress: Not on file  ?Social Connections: Not on file  ?Intimate Partner Violence: Not on file  ? ? ?  ?Family History  ?Problem Relation Age of Onset  ? Alzheimer's disease Father   ? Dementia Mother   ? ? ?Vitals:  ? 09/17/21 1446  ?BP: (!) 82/56  ?Pulse: 92  ?SpO2: 93%  ?Weight: 87.5 kg (192 lb 12.8 oz)  ? ? ?PHYSICAL EXAM: ?General:  Appears younger than stated age. Hard of hearing No respiratory difficulty ?HEENT: normal ?Neck: supple. no JVD. Carotids 2+ bilat; no bruits. No lymphadenopathy or thryomegaly appreciated. ?Cor: PMI nondisplaced. Regular rate & rhythm. 2/6 AS ?Lungs: clear ?Abdomen: soft, nontender, nondistended. No hepatosplenomegaly. No bruits or masses. Good bowel sounds. ?Extremities:  no cyanosis, clubbing, rash, edema ?Neuro: alert & oriented x 3, cranial nerves grossly intact. moves all 4 extremities w/o difficulty. Affect pleasant. ? ?ECG: SR 94 LVH  +1 PVC Personally reviewed ? ? ? ?ASSESSMENT & PLAN:  ? ?1. 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 Mild AS and normal RH pressures. 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 10% PVCs 9.6% PACs ?- Echo 4/23 EF < 20% RV moderately down. Mild to moderate AS ?- Suspect he may have component of PVC cardiomyopathy ?- NYHA IIIB  ?- Volume status low on torsemide 20 . Stop torsemide ?- GDMT limited by low BP ?- Continue farxiga 10 ?- Stop spiro ?- Valsartan stopped recently due to low BP ?- Can use midodrine as need to support BP  ?- Will repeat Zio to see if PVCs suppressed. ?- Plan cMRI ?- He is very tenuous. Etiology of CM unclear. Seems out of proportion to CAD. ? Component of PVC cardiomyopathy. Plan cMRI and repeat zio to reassess PVC burden.  ? ?2. Frequent PVCs ?- On mexilitene  ?- Repeat Zio to see if PVCs suppressed ? ?3. Mild to moderate AS ?- follow ? ?4. CAD ?- no s/s angina ?- continue DAPT/statin  ? ?Total time spent 50 minutes. Over half that time spent discussing above.  ? ?Glori Bickers, MD  ?5:23 PM ? ?

## 2021-09-20 ENCOUNTER — Ambulatory Visit: Payer: Medicare Other | Admitting: Cardiology

## 2021-09-20 ENCOUNTER — Encounter (INDEPENDENT_AMBULATORY_CARE_PROVIDER_SITE_OTHER): Payer: Medicare Other | Admitting: Cardiology

## 2021-09-20 DIAGNOSIS — G4733 Obstructive sleep apnea (adult) (pediatric): Secondary | ICD-10-CM

## 2021-09-20 DIAGNOSIS — G4734 Idiopathic sleep related nonobstructive alveolar hypoventilation: Secondary | ICD-10-CM | POA: Diagnosis not present

## 2021-09-20 NOTE — Progress Notes (Deleted)
Electrophysiology Office Note   Date:  09/20/2021   ID:  Derek [name], DOB 08/22/1940, MRN [id number]  PCP:  Derek Able, FNP  Cardiologist: Derek [name] Primary Electrophysiologist:  Derek [name] Derek Leeds, MD    Chief Complaint: PVCs   History of Present Illness: Derek [name] is a 81 y.o. male who is being seen today for the evaluation of PVCs at the request of Derek [name], F*. Presenting today for electrophysiology evaluation.  He has a history similar for coronary artery disease with CTO of the circumflex, aortic stenosis, PVCs, SVT, ischemic cardiomyopathy with an ejection fraction of 30 to 35%.  He has PAD with a left femoropopliteal bypass more than 20 years ago and PCI and stenting of the left common femoral and internal iliac June 2019.  He has COPD.  He has been started on mexiletine for his PVCs.  Today, denies symptoms of palpitations, chest pain, shortness of breath, orthopnea, PND, lower extremity edema, claudication, dizziness, presyncope, syncope, bleeding, or neurologic sequela. The patient is tolerating medications without difficulties. ***    Past Medical History:  Diagnosis Date   Androgen deficiency    Arthritis    Atherosclerosis of native arteries of the extremities with intermittent claudication 02/04/2014   B12 deficiency    Claudication (HCC)    COPD (chronic obstructive pulmonary disease) ([place])    COPD GOLD II     Pfts 07/15/13:  FeV1 1.46 (56%)  59% ratio 68   RV/TLC 145%  dlco  59%> 66%     Degenerative arthritis    Essential hypertension    Hypertension    IBS (irritable bowel syndrome)    Obese    PAD (peripheral artery disease) (HCC)    Pulmonary nodule    Pure hypercholesterolemia    Ulcerative colitis ([place])    Past Surgical History:  Procedure Laterality Date   ABDOMINAL AORTOGRAM W/LOWER EXTREMITY N/A 10/30/2017   Procedure: ABDOMINAL AORTOGRAM W/LOWER EXTREMITY;  Surgeon: Derek Sandy, MD;  Location: [facility];  Service: Cardiovascular;  Laterality: N/A;   AORTA - ILIAC ARTERY BYPASS GRAFT     PERIPHERAL VASCULAR INTERVENTION  10/30/2017   Procedure: PERIPHERAL VASCULAR INTERVENTION;  Surgeon: Derek Sandy, MD;  Location: [facility];  Service: Cardiovascular;;  LT Iliac   RIGHT/LEFT HEART CATH AND CORONARY ANGIOGRAPHY N/A 09/04/2020   Procedure: RIGHT/LEFT HEART CATH AND CORONARY ANGIOGRAPHY;  Surgeon: Derek Mocha, MD;  Location: [facility];  Service: Cardiovascular;  Laterality: N/A;     Current Outpatient Medications  Medication Sig Dispense Refill   Ascorbic Acid (VITAMIN C) 1000 MG tablet Take 1,000 mg by mouth daily.     atorvastatin (LIPITOR) 10 MG tablet Take 10 mg by mouth at bedtime.  5   clopidogrel (PLAVIX) 75 MG tablet Take 75 mg by mouth daily.     Cyanocobalamin (B-12) 1000 MCG TABS Take 1 tablet by mouth daily.     dapagliflozin propanediol (FARXIGA) 10 MG TABS tablet Take 1 tablet (10 mg total) by mouth daily before breakfast. 90 tablet 3   ferrous sulfate 325 (65 FE) MG tablet Take 325 mg by mouth daily with breakfast.     mesalamine (LIALDA) 1.2 g EC tablet Take 4.8 g by mouth every morning.     mexiletine (MEXITIL) 250 MG capsule TAKE 1 CAPSULE BY MOUTH 2 TIMES DAILY. 180 capsule 1   nitroGLYCERIN (NITROSTAT) 0.4 MG SL tablet Place 1 tablet (0.4 mg total) under the  tongue every 5 (five) minutes as needed. 30 tablet 3   torsemide (DEMADEX) 20 MG tablet Take 1 tablet (20 mg total) by mouth daily as needed. If weight is 194 or greater 90 tablet 3   Zinc Oxide 10 % OINT Apply 1 application topically as needed (burning).     No current facility-administered medications for this visit.    Allergies:   Aleve [naproxen sodium], Entresto [sacubitril-valsartan], and Metoprolol   Social History:  The patient  reports that he quit smoking about 25 years ago. His smoking use included cigarettes. He has a 115.00 pack-year smoking history. He has been  exposed to tobacco smoke. He has never used smokeless tobacco. He reports current alcohol use of about 3.0 standard drinks per week. He reports that he does not use drugs.   Family History:  The patient's family history includes Alzheimer's disease in his father; Dementia in his mother.   ROS:  Please see the history of present illness.   Otherwise, review of systems is positive for none.   All other systems are reviewed and negative.   PHYSICAL EXAM: VS:  There were no vitals taken for this visit. , BMI There is no height or weight on file to calculate BMI. GEN: Well nourished, well developed, in no acute distress  HEENT: normal  Neck: no JVD, carotid bruits, or masses Cardiac: ***RRR; no murmurs, rubs, or gallops,no edema  Respiratory:  clear to auscultation bilaterally, normal work of breathing GI: soft, nontender, nondistended, + BS MS: no deformity or atrophy  Skin: warm and dry Neuro:  Strength and sensation are intact Psych: euthymic mood, full affect  EKG:  EKG {ACTION; IS/IS [id number] ordered today. Personal review of the ekg ordered *** shows ***   Recent Labs: 04/01/2021: ALT 22 09/03/2021: NT-Pro BNP 3,969 09/10/2021: BUN 30; Creatinine, Ser 1.33; Potassium 5.6; Sodium 137    Lipid Panel  No results found for: CHOL, TRIG, HDL, CHOLHDL, VLDL, LDLCALC, LDLDIRECT   Wt Readings from Last 3 Encounters:  09/17/21 192 lb 12.8 oz (87.5 kg)  09/10/21 194 lb (88 kg)  09/03/21 194 lb (88 kg)      Other studies Reviewed: Additional studies/ records that were reviewed today include: TTE 05/18/20  Review of the above records today demonstrates:   1. Left ventricular ejection fraction, by estimation, is 30 to 35%. The  left ventricle has moderately decreased function. The left ventricle has  no regional wall motion abnormalities. There is mild left ventricular  hypertrophy. Left ventricular  diastolic parameters are consistent with Grade I diastolic dysfunction   (impaired relaxation). Unable to assess accurate EF secondary to frequent  ectopy.   2. Right ventricular systolic function is normal. The right ventricular  size is normal. There is normal pulmonary artery systolic pressure.   3. The mitral valve is normal in structure. Mild mitral valve  regurgitation. No evidence of mitral stenosis.   4. The aortic valve is normal in structure. Aortic valve regurgitation is  not visualized. Mild aortic valve stenosis.  RHC/LHC 09/04/2020 1. Single vessel CAD with total occlusion of the mid circumflex (dominant vessel) 2. Mild aortic stenosis with mean gradient 15 mmHg (valve easily crossed with J-wire) 3. Normal right heart pressures and normal LVEDP  Cardiac monitor 10/12/2020 personally reviewed frequent ventricular ectopy with brief runs of nonsustained VT and frequent supraventricular ectopy with brief runs of SVT.  ASSESSMENT AND PLAN:  1.  Chronic systolic heart failure due to ischemic cardiomyopathy: Currently on Farxiga 10  mg twice daily, torsemide 20 mg daily.  Plan per heart failure cardiology.  2.  Coronary artery disease: CTO of the LAD.  Currently on Plavix 75 mg.  No current chest pain.  3.  PVCs: Cardiac monitor with an 11% burden.  Holding off on ICD due to his age of 66.  Currently on mexiletine 250 mg twice daily.  High risk medication monitoring for mexiletine.***   Current medicines are reviewed at length with the patient today.   The patient does not have concerns regarding his medicines.  The following changes were made today: ***  Labs/ tests ordered today include:  No orders of the defined types were placed in this encounter.    Disposition:   FU with Waris Rodger *** months  Signed, Esra Frankowski Derek Leeds, MD  09/20/2021 8:51 AM     [facility] [address] [place] [address] [phone] (office) [phone] (fax)

## 2021-09-21 DIAGNOSIS — I5022 Chronic systolic (congestive) heart failure: Secondary | ICD-10-CM | POA: Diagnosis not present

## 2021-09-21 DIAGNOSIS — I959 Hypotension, unspecified: Secondary | ICD-10-CM | POA: Diagnosis not present

## 2021-09-21 DIAGNOSIS — I110 Hypertensive heart disease with heart failure: Secondary | ICD-10-CM | POA: Diagnosis not present

## 2021-09-21 DIAGNOSIS — Z7951 Long term (current) use of inhaled steroids: Secondary | ICD-10-CM | POA: Diagnosis not present

## 2021-09-21 DIAGNOSIS — E7800 Pure hypercholesterolemia, unspecified: Secondary | ICD-10-CM | POA: Diagnosis not present

## 2021-09-21 DIAGNOSIS — Z7984 Long term (current) use of oral hypoglycemic drugs: Secondary | ICD-10-CM | POA: Diagnosis not present

## 2021-09-21 DIAGNOSIS — Z7902 Long term (current) use of antithrombotics/antiplatelets: Secondary | ICD-10-CM | POA: Diagnosis not present

## 2021-09-21 DIAGNOSIS — K5730 Diverticulosis of large intestine without perforation or abscess without bleeding: Secondary | ICD-10-CM | POA: Diagnosis not present

## 2021-09-21 DIAGNOSIS — Z87891 Personal history of nicotine dependence: Secondary | ICD-10-CM | POA: Diagnosis not present

## 2021-09-21 DIAGNOSIS — J449 Chronic obstructive pulmonary disease, unspecified: Secondary | ICD-10-CM | POA: Diagnosis not present

## 2021-09-22 DIAGNOSIS — Z7951 Long term (current) use of inhaled steroids: Secondary | ICD-10-CM | POA: Diagnosis not present

## 2021-09-22 DIAGNOSIS — Z7902 Long term (current) use of antithrombotics/antiplatelets: Secondary | ICD-10-CM | POA: Diagnosis not present

## 2021-09-22 DIAGNOSIS — I110 Hypertensive heart disease with heart failure: Secondary | ICD-10-CM | POA: Diagnosis not present

## 2021-09-22 DIAGNOSIS — K5730 Diverticulosis of large intestine without perforation or abscess without bleeding: Secondary | ICD-10-CM | POA: Diagnosis not present

## 2021-09-22 DIAGNOSIS — Z7984 Long term (current) use of oral hypoglycemic drugs: Secondary | ICD-10-CM | POA: Diagnosis not present

## 2021-09-22 DIAGNOSIS — I959 Hypotension, unspecified: Secondary | ICD-10-CM | POA: Diagnosis not present

## 2021-09-22 DIAGNOSIS — I5022 Chronic systolic (congestive) heart failure: Secondary | ICD-10-CM | POA: Diagnosis not present

## 2021-09-22 DIAGNOSIS — E7800 Pure hypercholesterolemia, unspecified: Secondary | ICD-10-CM | POA: Diagnosis not present

## 2021-09-22 DIAGNOSIS — Z87891 Personal history of nicotine dependence: Secondary | ICD-10-CM | POA: Diagnosis not present

## 2021-09-22 DIAGNOSIS — J449 Chronic obstructive pulmonary disease, unspecified: Secondary | ICD-10-CM | POA: Diagnosis not present

## 2021-09-22 NOTE — Procedures (Signed)
? ?  SLEEP STUDY REPORT ?Patient Information ?Study Date: 09/20/21 ?Patient Name: Derek [name] ?Patient ID: [id number] ?Birth Date: December 04, 2040 ?Age: 81 ?Gender: Male ?Referring Physician:Daniel Bensimhon, MD ? ?TEST DESCRIPTION: Home sleep apnea testing was completed using the WatchPat, a Type 1 device, utilizing ?peripheral arterial tonometry (PAT), chest movement, actigraphy, pulse oximetry, pulse rate, body position and snore. ?AHI was calculated with apnea and hypopnea using valid sleep time as the denominator. RDI includes apneas, ?hypopneas, and RERAs. The data acquired and the scoring of sleep and all associated events were performed in ?accordance with the recommended standards and specifications as outlined in the AASM Manual for the Scoring of ?Sleep and Associated Events 2.2.0 (2015). ? ?FINDINGS: ?1. Severe Obstructive Sleep Apnea with AHI 50.7/hr. ?2. Moderate Central Sleep Apnea with pAHIc 19.4/hr. ?3. Oxygen desaturations as low as 80%. ?4. Moderate snoring was present. O2 sats were < 88% for 18.3 min. ?5. Total sleep time was 7 hrs and 41 min. ?6. 21.8% of total sleep time was spent in REM sleep. ?7. Normal sleep onset latency at 18 min. ?8. Shortened REM sleep onset latency at 66 min. ?9. Total awakenings were 25. ? ?DIAGNOSIS: ?Severe Obstructive Sleep Apnea (G47.33) ?Nocturnal Hypoxemia ? ?RECOMMENDATIONS: ?1. Clinical correlation of these findings is necessary. The decision to treat obstructive sleep apnea (OSA) is usually ?based on the presence of apnea symptoms or the presence of associated medical conditions such as Hypertension, ?Congestive Heart Failure, Atrial Fibrillation or Obesity. The most common symptoms of OSA are snoring, gasping for ?breath while sleeping, daytime sleepiness and fatigue. ? ?2. Initiating apnea therapy is recommended given the presence of symptoms and/or associated conditions. ?Recommend proceeding with one of the following: ? ? a. Auto-CPAP therapy with a pressure  range of 5-20cm H2O. ? ? b. An oral appliance (OA) that can be obtained from certain dentists with expertise in sleep medicine. These are ?primarily of use in non-obese patients with mild and moderate disease. ? ? c. An ENT consultation which may be useful to look for specific causes of obstruction and possible treatment ?options. ? ? d. If patient is intolerant to PAP therapy, consider referral to ENT for evaluation for hypoglossal nerve stimulator. ? ?3. Close follow-up is necessary to ensure success with CPAP or oral appliance therapy for maximum benefit . ? ?4. A follow-up oximetry study on CPAP is recommended to assess the adequacy of therapy and determine the need ?for supplemental oxygen or the potential need for Bi-level therapy. An arterial blood gas to determine the adequacy of ?baseline ventilation and oxygenation should also be considered. ? ?5. Healthy sleep recommendations include: adequate nightly sleep (normal 7-9 hrs/night), avoidance of caffeine after ?noon and alcohol near bedtime, and maintaining a sleep environment that is cool, dark and quiet. ? ?6. Weight loss for overweight patients is recommended. Even modest amounts of weight loss can significantly ?improve the severity of sleep apnea. ? ?7. Snoring recommendations include: weight loss where appropriate, side sleeping, and avoidance of alcohol before ?bed. ? ?8. Operation of motor vehicle should be avoided when sleepy. ? ?Signature: ?Electronically Signed: 09/23/21 ?Armanda Magic, MD; [facility]; Diplomat, American Board of Sleep Medicine ? ?

## 2021-09-23 ENCOUNTER — Ambulatory Visit: Payer: Medicare Other

## 2021-09-23 DIAGNOSIS — I5022 Chronic systolic (congestive) heart failure: Secondary | ICD-10-CM

## 2021-09-24 ENCOUNTER — Telehealth: Payer: Self-pay | Admitting: *Deleted

## 2021-09-24 ENCOUNTER — Other Ambulatory Visit: Payer: Self-pay | Admitting: Cardiology

## 2021-09-24 DIAGNOSIS — G4736 Sleep related hypoventilation in conditions classified elsewhere: Secondary | ICD-10-CM

## 2021-09-24 DIAGNOSIS — G4733 Obstructive sleep apnea (adult) (pediatric): Secondary | ICD-10-CM

## 2021-09-24 NOTE — Telephone Encounter (Signed)
Spoke with patient to discuss his HST results and recommendations. He asked that I call his daughter in law, Duwayne Heck with the information. She was contacted and given the results and recommendations and agrees to proceed with CPAP titration. ?

## 2021-09-24 NOTE — Telephone Encounter (Signed)
-----   Message from Sueanne Margarita, MD sent at 09/22/2021 10:38 PM EDT ----- ?Please let patient know that they have sleep apnea.  Recommend therapeutic CPAP titration for treatment of patient's sleep disordered breathing.  If unable to perform an in lab titration then initiate ResMed auto CPAP from 4 to 15cm H2O with heated humidity and mask of choice and overnight pulse ox on CPAP.    ?

## 2021-09-28 DIAGNOSIS — K5730 Diverticulosis of large intestine without perforation or abscess without bleeding: Secondary | ICD-10-CM | POA: Diagnosis not present

## 2021-09-28 DIAGNOSIS — I5022 Chronic systolic (congestive) heart failure: Secondary | ICD-10-CM | POA: Diagnosis not present

## 2021-09-28 DIAGNOSIS — E7800 Pure hypercholesterolemia, unspecified: Secondary | ICD-10-CM | POA: Diagnosis not present

## 2021-09-28 DIAGNOSIS — I110 Hypertensive heart disease with heart failure: Secondary | ICD-10-CM | POA: Diagnosis not present

## 2021-09-28 DIAGNOSIS — Z7951 Long term (current) use of inhaled steroids: Secondary | ICD-10-CM | POA: Diagnosis not present

## 2021-09-28 DIAGNOSIS — Z7984 Long term (current) use of oral hypoglycemic drugs: Secondary | ICD-10-CM | POA: Diagnosis not present

## 2021-09-28 DIAGNOSIS — I959 Hypotension, unspecified: Secondary | ICD-10-CM | POA: Diagnosis not present

## 2021-09-28 DIAGNOSIS — Z7902 Long term (current) use of antithrombotics/antiplatelets: Secondary | ICD-10-CM | POA: Diagnosis not present

## 2021-09-28 DIAGNOSIS — J449 Chronic obstructive pulmonary disease, unspecified: Secondary | ICD-10-CM | POA: Diagnosis not present

## 2021-09-28 DIAGNOSIS — Z87891 Personal history of nicotine dependence: Secondary | ICD-10-CM | POA: Diagnosis not present

## 2021-10-04 DIAGNOSIS — R0602 Shortness of breath: Secondary | ICD-10-CM | POA: Diagnosis not present

## 2021-10-04 DIAGNOSIS — I350 Nonrheumatic aortic (valve) stenosis: Secondary | ICD-10-CM | POA: Diagnosis not present

## 2021-10-04 DIAGNOSIS — I429 Cardiomyopathy, unspecified: Secondary | ICD-10-CM | POA: Diagnosis not present

## 2021-10-04 DIAGNOSIS — Z79899 Other long term (current) drug therapy: Secondary | ICD-10-CM | POA: Diagnosis not present

## 2021-10-04 DIAGNOSIS — G4733 Obstructive sleep apnea (adult) (pediatric): Secondary | ICD-10-CM | POA: Diagnosis not present

## 2021-10-04 DIAGNOSIS — I493 Ventricular premature depolarization: Secondary | ICD-10-CM | POA: Diagnosis not present

## 2021-10-04 DIAGNOSIS — Z7982 Long term (current) use of aspirin: Secondary | ICD-10-CM | POA: Diagnosis not present

## 2021-10-04 DIAGNOSIS — Z7902 Long term (current) use of antithrombotics/antiplatelets: Secondary | ICD-10-CM | POA: Diagnosis not present

## 2021-10-04 DIAGNOSIS — J811 Chronic pulmonary edema: Secondary | ICD-10-CM | POA: Diagnosis not present

## 2021-10-04 DIAGNOSIS — I110 Hypertensive heart disease with heart failure: Secondary | ICD-10-CM | POA: Diagnosis not present

## 2021-10-04 DIAGNOSIS — I509 Heart failure, unspecified: Secondary | ICD-10-CM | POA: Diagnosis not present

## 2021-10-04 DIAGNOSIS — I959 Hypotension, unspecified: Secondary | ICD-10-CM | POA: Diagnosis not present

## 2021-10-04 DIAGNOSIS — Z886 Allergy status to analgesic agent status: Secondary | ICD-10-CM | POA: Diagnosis not present

## 2021-10-04 DIAGNOSIS — Z87891 Personal history of nicotine dependence: Secondary | ICD-10-CM | POA: Diagnosis not present

## 2021-10-04 DIAGNOSIS — J9601 Acute respiratory failure with hypoxia: Secondary | ICD-10-CM | POA: Diagnosis not present

## 2021-10-04 DIAGNOSIS — R0682 Tachypnea, not elsewhere classified: Secondary | ICD-10-CM | POA: Diagnosis not present

## 2021-10-04 DIAGNOSIS — E7800 Pure hypercholesterolemia, unspecified: Secondary | ICD-10-CM | POA: Diagnosis not present

## 2021-10-04 DIAGNOSIS — I255 Ischemic cardiomyopathy: Secondary | ICD-10-CM | POA: Diagnosis not present

## 2021-10-04 DIAGNOSIS — I2510 Atherosclerotic heart disease of native coronary artery without angina pectoris: Secondary | ICD-10-CM | POA: Diagnosis not present

## 2021-10-04 DIAGNOSIS — I5023 Acute on chronic systolic (congestive) heart failure: Secondary | ICD-10-CM | POA: Diagnosis not present

## 2021-10-05 DIAGNOSIS — I2510 Atherosclerotic heart disease of native coronary artery without angina pectoris: Secondary | ICD-10-CM | POA: Diagnosis not present

## 2021-10-05 DIAGNOSIS — I509 Heart failure, unspecified: Secondary | ICD-10-CM | POA: Diagnosis not present

## 2021-10-05 DIAGNOSIS — I493 Ventricular premature depolarization: Secondary | ICD-10-CM

## 2021-10-05 DIAGNOSIS — I350 Nonrheumatic aortic (valve) stenosis: Secondary | ICD-10-CM | POA: Diagnosis not present

## 2021-10-05 DIAGNOSIS — I429 Cardiomyopathy, unspecified: Secondary | ICD-10-CM | POA: Diagnosis not present

## 2021-10-05 DIAGNOSIS — I959 Hypotension, unspecified: Secondary | ICD-10-CM

## 2021-10-06 ENCOUNTER — Telehealth (HOSPITAL_COMMUNITY): Payer: Self-pay | Admitting: *Deleted

## 2021-10-06 DIAGNOSIS — I350 Nonrheumatic aortic (valve) stenosis: Secondary | ICD-10-CM | POA: Diagnosis not present

## 2021-10-06 DIAGNOSIS — I509 Heart failure, unspecified: Secondary | ICD-10-CM | POA: Diagnosis not present

## 2021-10-06 DIAGNOSIS — I2510 Atherosclerotic heart disease of native coronary artery without angina pectoris: Secondary | ICD-10-CM | POA: Diagnosis not present

## 2021-10-06 DIAGNOSIS — I429 Cardiomyopathy, unspecified: Secondary | ICD-10-CM | POA: Diagnosis not present

## 2021-10-06 NOTE — Telephone Encounter (Signed)
Reaching out regarding this patient's scheduled cardiac MRI. He states he is admitted at a hospital in [place] and cannot make his appointment. Informed him that I will cancel his May 18 appointment and someone will reach out to reschedule him. He verbalized understanding. ? ?Larey Brick RN Navigator Cardiac Imaging ? Heart [facility] V[facility] Services ?[phone] Office ?[phone] Cell ? ?

## 2021-10-07 ENCOUNTER — Ambulatory Visit (HOSPITAL_COMMUNITY): Admission: RE | Admit: 2021-10-07 | Payer: Medicare Other | Source: Ambulatory Visit

## 2021-10-07 ENCOUNTER — Other Ambulatory Visit (HOSPITAL_COMMUNITY): Payer: Medicare Other

## 2021-10-07 DIAGNOSIS — I350 Nonrheumatic aortic (valve) stenosis: Secondary | ICD-10-CM | POA: Diagnosis not present

## 2021-10-07 DIAGNOSIS — I429 Cardiomyopathy, unspecified: Secondary | ICD-10-CM | POA: Diagnosis not present

## 2021-10-07 DIAGNOSIS — I509 Heart failure, unspecified: Secondary | ICD-10-CM | POA: Diagnosis not present

## 2021-10-07 DIAGNOSIS — I2510 Atherosclerotic heart disease of native coronary artery without angina pectoris: Secondary | ICD-10-CM | POA: Diagnosis not present

## 2021-10-07 DIAGNOSIS — I493 Ventricular premature depolarization: Secondary | ICD-10-CM | POA: Diagnosis not present

## 2021-10-07 NOTE — Addendum Note (Signed)
Encounter addended by: Crissie Figures, RN on: 10/07/2021 10:59 AM  Actions taken: Imaging Exam ended

## 2021-10-08 DIAGNOSIS — I5022 Chronic systolic (congestive) heart failure: Secondary | ICD-10-CM | POA: Diagnosis not present

## 2021-10-08 DIAGNOSIS — K5730 Diverticulosis of large intestine without perforation or abscess without bleeding: Secondary | ICD-10-CM | POA: Diagnosis not present

## 2021-10-08 DIAGNOSIS — Z7951 Long term (current) use of inhaled steroids: Secondary | ICD-10-CM | POA: Diagnosis not present

## 2021-10-08 DIAGNOSIS — Z87891 Personal history of nicotine dependence: Secondary | ICD-10-CM | POA: Diagnosis not present

## 2021-10-08 DIAGNOSIS — J449 Chronic obstructive pulmonary disease, unspecified: Secondary | ICD-10-CM | POA: Diagnosis not present

## 2021-10-08 DIAGNOSIS — Z7984 Long term (current) use of oral hypoglycemic drugs: Secondary | ICD-10-CM | POA: Diagnosis not present

## 2021-10-08 DIAGNOSIS — E7800 Pure hypercholesterolemia, unspecified: Secondary | ICD-10-CM | POA: Diagnosis not present

## 2021-10-08 DIAGNOSIS — Z7902 Long term (current) use of antithrombotics/antiplatelets: Secondary | ICD-10-CM | POA: Diagnosis not present

## 2021-10-08 DIAGNOSIS — I959 Hypotension, unspecified: Secondary | ICD-10-CM | POA: Diagnosis not present

## 2021-10-08 DIAGNOSIS — I110 Hypertensive heart disease with heart failure: Secondary | ICD-10-CM | POA: Diagnosis not present

## 2021-10-10 ENCOUNTER — Ambulatory Visit (HOSPITAL_BASED_OUTPATIENT_CLINIC_OR_DEPARTMENT_OTHER): Payer: Medicare Other | Attending: Cardiology | Admitting: Cardiology

## 2021-10-10 VITALS — Ht 71.0 in | Wt 190.0 lb

## 2021-10-10 DIAGNOSIS — G4734 Idiopathic sleep related nonobstructive alveolar hypoventilation: Secondary | ICD-10-CM

## 2021-10-10 DIAGNOSIS — G4733 Obstructive sleep apnea (adult) (pediatric): Secondary | ICD-10-CM | POA: Diagnosis not present

## 2021-10-10 DIAGNOSIS — G4736 Sleep related hypoventilation in conditions classified elsewhere: Secondary | ICD-10-CM | POA: Insufficient documentation

## 2021-10-11 ENCOUNTER — Other Ambulatory Visit: Payer: Self-pay

## 2021-10-11 DIAGNOSIS — I959 Hypotension, unspecified: Secondary | ICD-10-CM | POA: Diagnosis not present

## 2021-10-11 DIAGNOSIS — Z87891 Personal history of nicotine dependence: Secondary | ICD-10-CM | POA: Diagnosis not present

## 2021-10-11 DIAGNOSIS — K5730 Diverticulosis of large intestine without perforation or abscess without bleeding: Secondary | ICD-10-CM | POA: Diagnosis not present

## 2021-10-11 DIAGNOSIS — J449 Chronic obstructive pulmonary disease, unspecified: Secondary | ICD-10-CM | POA: Diagnosis not present

## 2021-10-11 DIAGNOSIS — E7800 Pure hypercholesterolemia, unspecified: Secondary | ICD-10-CM | POA: Diagnosis not present

## 2021-10-11 DIAGNOSIS — Z7902 Long term (current) use of antithrombotics/antiplatelets: Secondary | ICD-10-CM | POA: Diagnosis not present

## 2021-10-11 DIAGNOSIS — Z7984 Long term (current) use of oral hypoglycemic drugs: Secondary | ICD-10-CM | POA: Diagnosis not present

## 2021-10-11 DIAGNOSIS — I5022 Chronic systolic (congestive) heart failure: Secondary | ICD-10-CM | POA: Diagnosis not present

## 2021-10-11 DIAGNOSIS — I110 Hypertensive heart disease with heart failure: Secondary | ICD-10-CM | POA: Diagnosis not present

## 2021-10-11 DIAGNOSIS — Z7951 Long term (current) use of inhaled steroids: Secondary | ICD-10-CM | POA: Diagnosis not present

## 2021-10-11 NOTE — Progress Notes (Unsigned)
Cardiology Office Note:    Date:  10/11/2021   ID:  RENALD [name], DOB Mar 24, 1941, MRN [id number]  PCP:  Leilani Able, FNP  Cardiologist:  Shirlee More, MD    Referring MD: Clydie Braun T, F*    ASSESSMENT:    No diagnosis found. PLAN:    In order of problems listed above:  ***   Next appointment: ***   Medication Adjustments/Labs and Tests Ordered: Current medicines are reviewed at length with the patient today.  Concerns regarding medicines are outlined above.  No orders of the defined types were placed in this encounter.  No orders of the defined types were placed in this encounter.   No chief complaint on file.   History of Present Illness:    Derek [name] is a 81 y.o. male with a hx of very complex heart disease including CAD with chronic occlusion of the left circumflex coronary artery mild aortic stenosis frequent PVCs and APCs with brief episodes of SVT cardiomyopathy EF 30 to 35% hypertensive heart disease and peripheral arterial disease.  He has multiple mechanisms for shortness of breath including cardiomyopathy CAD underlying lung disease treated with steroids and bronchodilators.  He has been intolerant of both Entresto and beta-blocker.  He has been seen by EP for his PVC burden and decision was made to hold on ICD therapy and was initiated on mexiletine to suppress his PVCs last seen last week as an inpatient at [facility] after being admitted with decompensated heart failure and ejection fraction is now severely reduced less than 20%.  He has been referred to both advanced heart failure as well as back to EP for reconsideration of ICD therapy. very complex heart disease including CAD with chronic occlusion of the left circumflex coronary artery mild aortic stenosis frequent PVCs and APCs with brief episodes of SVT cardiomyopathy EF 30 to 35% hypertensive heart disease and peripheral arterial disease.  He has multiple mechanisms for shortness of  breath including cardiomyopathy CAD underlying lung disease treated with steroids and bronchodilators.  He has been intolerant of both Entresto and beta-blocker.  He has been seen by EP for his PVC burden and decision was made to hold on ICD therapy and was initiated on mexiletine to suppress his PVCs last seen last week as an inpatient at [facility] after being admitted with decompensated heart failure and ejection fraction is now severely reduced less than 20%.  He has been referred to both advanced heart failure as well as back to EP for reconsideration of ICD therapy.  last seen by me 09/10/2021.  He was recently seen by Dr. Haroldine Laws advanced heart failure about his progressive cardiomyopathy ejection fraction less than 20% and intolerance of medications including ARB loop diuretic as well as his frequent PVCs.  Recent event monitor showed the presence of brief episodes of nonsustained VT and occasional PVCs.  He is pending a cardiac MR  Compliance with diet, lifestyle and medications: *** Past Medical History:  Diagnosis Date   Androgen deficiency    Arthritis    Atherosclerosis of native arteries of the extremities with intermittent claudication 02/04/2014   B12 deficiency    Claudication [facility])    COPD (chronic obstructive pulmonary disease) (HCC)    COPD GOLD II     Pfts 07/15/13:  FeV1 1.46 (56%)  59% ratio 68   RV/TLC 145%  dlco  59%> 66%     Degenerative arthritis    Essential hypertension    Hypertension  IBS (irritable bowel syndrome)    Obese    PAD (peripheral artery disease) (HCC)    Pulmonary nodule    Pure hypercholesterolemia    Ulcerative colitis ([place])     Past Surgical History:  Procedure Laterality Date   ABDOMINAL AORTOGRAM W/LOWER EXTREMITY N/A 10/30/2017   Procedure: ABDOMINAL AORTOGRAM W/LOWER EXTREMITY;  Surgeon: Waynetta Sandy, MD;  Location: [place] [facility];  Service: Cardiovascular;  Laterality: N/A;   AORTA - ILIAC ARTERY BYPASS GRAFT      PERIPHERAL VASCULAR INTERVENTION  10/30/2017   Procedure: PERIPHERAL VASCULAR INTERVENTION;  Surgeon: Waynetta Sandy, MD;  Location: [place] [facility];  Service: Cardiovascular;;  LT Iliac   RIGHT/LEFT HEART CATH AND CORONARY ANGIOGRAPHY N/A 09/04/2020   Procedure: RIGHT/LEFT HEART CATH AND CORONARY ANGIOGRAPHY;  Surgeon: Sherren Mocha, MD;  Location: [place] [facility];  Service: Cardiovascular;  Laterality: N/A;    Current Medications: No outpatient medications have been marked as taking for the 10/12/21 encounter (Appointment) with Richardo Priest, MD.     Allergies:   Aleve [naproxen sodium], Entresto [sacubitril-valsartan], and Metoprolol   Social History   Socioeconomic History   Marital status: Married    Spouse name: Not on file   Number of children: Not on file   Years of education: Not on file   Highest education level: Not on file  Occupational History   Occupation: Retired  Tobacco Use   Smoking status: Former    Packs/day: 2.50    Years: 46.00    Pack years: 115.00    Types: Cigarettes    Quit date: 05/23/1996    Years since quitting: 25.4    Passive exposure: Past   Smokeless tobacco: Never  Vaping Use   Vaping Use: Never used  Substance and Sexual Activity   Alcohol use: Yes    Alcohol/week: 3.0 standard drinks    Types: 3 Cans of beer per week   Drug use: No   Sexual activity: Not on file  Other Topics Concern   Not on file  Social History Narrative   Not on file   Social Determinants of Health   Financial Resource Strain: Not on file  Food Insecurity: Not on file  Transportation Needs: Not on file  Physical Activity: Not on file  Stress: Not on file  Social Connections: Not on file     Family History: The patient's ***family history includes Alzheimer's disease in his father; Dementia in his mother. ROS:   Please see the history of present illness.    All other systems reviewed and are negative.  EKGs/Labs/Other Studies Reviewed:     The following studies were reviewed today:  EKG:  EKG ordered today and personally reviewed.  The ekg ordered today demonstrates ***  Recent Labs: 04/01/2021: ALT 22 09/03/2021: NT-Pro BNP 3,969 09/10/2021: BUN 30; Creatinine, Ser 1.33; Potassium 5.6; Sodium 137  Recent Lipid Panel No results found for: CHOL, TRIG, HDL, CHOLHDL, VLDL, LDLCALC, LDLDIRECT  Physical Exam:    VS:  There were no vitals taken for this visit.    Wt Readings from Last 3 Encounters:  10/10/21 190 lb (86.2 kg)  09/17/21 192 lb 12.8 oz (87.5 kg)  09/10/21 194 lb (88 kg)     GEN: *** Well nourished, well developed in no acute distress HEENT: Normal NECK: No JVD; No carotid bruits LYMPHATICS: No lymphadenopathy CARDIAC: ***RRR, no murmurs, rubs, gallops RESPIRATORY:  Clear to auscultation without rales, wheezing or rhonchi  ABDOMEN: Soft, non-tender,  non-distended MUSCULOSKELETAL:  No edema; No deformity  SKIN: Warm and dry NEUROLOGIC:  Alert and oriented x 3 PSYCHIATRIC:  Normal affect    Signed, Shirlee More, MD  10/11/2021 12:27 PM    [facility]

## 2021-10-12 ENCOUNTER — Ambulatory Visit: Payer: Medicare Other | Admitting: Cardiology

## 2021-10-14 ENCOUNTER — Ambulatory Visit (HOSPITAL_COMMUNITY)
Admission: RE | Admit: 2021-10-14 | Discharge: 2021-10-14 | Disposition: A | Discharge status: Home / Self Care | Payer: Medicare Other | Source: Ambulatory Visit | Attending: Internal Medicine | Admitting: Internal Medicine

## 2021-10-14 ENCOUNTER — Encounter (HOSPITAL_COMMUNITY): Payer: Self-pay | Admitting: Internal Medicine

## 2021-10-14 VITALS — BP 100/70 | HR 68 | Wt 196.2 lb

## 2021-10-14 DIAGNOSIS — I493 Ventricular premature depolarization: Secondary | ICD-10-CM | POA: Diagnosis not present

## 2021-10-14 DIAGNOSIS — G4733 Obstructive sleep apnea (adult) (pediatric): Secondary | ICD-10-CM

## 2021-10-14 DIAGNOSIS — I2510 Atherosclerotic heart disease of native coronary artery without angina pectoris: Secondary | ICD-10-CM | POA: Insufficient documentation

## 2021-10-14 DIAGNOSIS — I5022 Chronic systolic (congestive) heart failure: Secondary | ICD-10-CM | POA: Diagnosis not present

## 2021-10-14 DIAGNOSIS — K5730 Diverticulosis of large intestine without perforation or abscess without bleeding: Secondary | ICD-10-CM | POA: Diagnosis not present

## 2021-10-14 DIAGNOSIS — I428 Other cardiomyopathies: Secondary | ICD-10-CM | POA: Diagnosis not present

## 2021-10-14 DIAGNOSIS — Z7984 Long term (current) use of oral hypoglycemic drugs: Secondary | ICD-10-CM | POA: Diagnosis not present

## 2021-10-14 DIAGNOSIS — I110 Hypertensive heart disease with heart failure: Secondary | ICD-10-CM | POA: Diagnosis not present

## 2021-10-14 DIAGNOSIS — Z7951 Long term (current) use of inhaled steroids: Secondary | ICD-10-CM | POA: Diagnosis not present

## 2021-10-14 DIAGNOSIS — I350 Nonrheumatic aortic (valve) stenosis: Secondary | ICD-10-CM | POA: Insufficient documentation

## 2021-10-14 DIAGNOSIS — Z87891 Personal history of nicotine dependence: Secondary | ICD-10-CM | POA: Diagnosis not present

## 2021-10-14 DIAGNOSIS — Z7902 Long term (current) use of antithrombotics/antiplatelets: Secondary | ICD-10-CM | POA: Diagnosis not present

## 2021-10-14 DIAGNOSIS — Z79899 Other long term (current) drug therapy: Secondary | ICD-10-CM | POA: Insufficient documentation

## 2021-10-14 DIAGNOSIS — J449 Chronic obstructive pulmonary disease, unspecified: Secondary | ICD-10-CM | POA: Insufficient documentation

## 2021-10-14 DIAGNOSIS — I739 Peripheral vascular disease, unspecified: Secondary | ICD-10-CM | POA: Diagnosis not present

## 2021-10-14 DIAGNOSIS — I959 Hypotension, unspecified: Secondary | ICD-10-CM | POA: Diagnosis not present

## 2021-10-14 DIAGNOSIS — E7800 Pure hypercholesterolemia, unspecified: Secondary | ICD-10-CM | POA: Diagnosis not present

## 2021-10-14 NOTE — Progress Notes (Signed)
ReDS Vest / Clip - 10/14/21 1600       ReDS Vest / Clip   Station Marker D    Ruler Value 31    ReDS Value Range Low volume    ReDS Actual Value 33    Anatomical Comments sitting

## 2021-10-14 NOTE — Progress Notes (Signed)
ADVANCED HF CLINIC NOTE  Referring Physician: Dr. Dulce Sellar Primary Care: Ninfa Meeker, FNP Primary Cardiologist: Dr. Dulce Sellar  HPI:  Derek [name] is an 81 y/o male with COPD, HTN, PAD with previous LE revascularization, CAD, severe hearing loss frequent PVCs and systolic HF. Referred by Dr. Dulce Sellar for further evaluation of his systolic HF  Echo 12/21 EF 30-35%  Cath 4/22 EF 35-45% Single vessel CAD with chronic occlusion of dominant LCx. Mild AS and normal RH pressures. 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 10% PVCs 9.6% PACs  Admitted to [facility] several weeks ago with marked volume overload. Readmitted a week or two later with volume depletion and SBP 60s. Valsartan stopped.   Echo 4/23 EF < 20% RV moderately down. Mild to moderate AS  Here with his granddaughter who is an Charity fundraiser. Wife currently home on hospice. I saw him one month ago for the first time. BP low so spiro stopped. Ordered cMRI, ZIO and sleep study.   Zio 5/23: Sinus rhythm 82 bpm. 8 runs NSVT, 211 runs of SVT  PVCs 4.8% PACs 6.1%  Home sleep study 09/21/21 Very severe SA  OSA 51/hr CSA 19/hr. Inpatient sleep study AHI ~20.  Admitted to [facility] last week with ADHF. Got IV lasix but weight didn't change much. Feels poorly. SOB with any exertion. Poor appetite. Doesn't take lasix       Past Medical History:  Diagnosis Date   Abdominal pain 09/14/2021   Acute exacerbation of CHF (congestive heart failure) (HCC) 09/14/2021   Acute on chronic respiratory failure with hypoxemia (HCC) 09/14/2021   Androgen deficiency    Aortic stenosis 08/25/2020   Arthritis    Atherosclerosis of native arteries of the extremities with intermittent claudication 02/04/2014   B12 deficiency    Bradycardia 09/14/2021   Bronchopneumonia 09/14/2021   CAD (coronary artery disease) 09/14/2021   Cardiomyopathy (HCC) 11/18/2020   Chronic systolic heart failure (HCC)    Claudication (HCC)    COPD (chronic obstructive  pulmonary disease) (HCC)    COPD GOLD II     Pfts 07/15/13:  FeV1 1.46 (56%)  59% ratio 68   RV/TLC 145%  dlco  59%> 66%     Degenerative arthritis    Depressed left ventricular ejection fraction 08/25/2020   Elevated troponin 09/14/2021   Essential hypertension    Hypertension    Hypertensive heart disease    Hypertensive heart disease with acute on chronic systolic congestive heart failure (HCC) 09/14/2021   IBS (irritable bowel syndrome)    Lactic acidosis 09/14/2021   Left ventricular dysfunction 07/03/2020   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     ASPIRIN 81 PO Take 81 mg by mouth daily.     atorvastatin (LIPITOR) 10 MG tablet Take 10 mg by mouth at bedtime.  5   carvedilol (COREG) 3.125 MG tablet Take 3.125 mg by mouth 2 (two) times daily.  clopidogrel (PLAVIX) 75 MG tablet Take 75 mg by mouth daily.     Cyanocobalamin (B-12) 1000 MCG TABS Take 1 tablet by mouth daily.     dapagliflozin propanediol (FARXIGA) 10 MG TABS tablet Take 1 tablet (10 mg total) by mouth daily before breakfast. 90 tablet 3   ferrous sulfate 325 (65 FE) MG tablet Take 325 mg by mouth daily with breakfast.     mesalamine (LIALDA) 1.2 g EC tablet Take 4.8 g by mouth every morning.     mexiletine (MEXITIL) 250 MG capsule TAKE 1 CAPSULE BY MOUTH 2 TIMES DAILY. 180 capsule 1   nitroGLYCERIN (NITROSTAT) 0.4 MG SL tablet Place 0.4 mg under the tongue every 5  (five) minutes as needed for chest pain.     POTASSIUM BICARBONATE PO Take 20 mEq by mouth daily.     predniSONE (DELTASONE) 10 MG tablet Take 10 mg by mouth every morning.     torsemide (DEMADEX) 20 MG tablet Take 1 tablet (20 mg total) by mouth daily as needed. If weight is 194 or greater 90 tablet 3   valsartan (DIOVAN) 40 MG tablet Take 40 mg by mouth daily.     Zinc Oxide 10 % OINT Apply 1 application topically as needed (burning).     No current facility-administered medications for this encounter.    Allergies  Allergen Reactions   Aleve [Naproxen Sodium]     Due to blood pressure   Entresto [Sacubitril-Valsartan] Other (See Comments)    Hypotensive    Metoprolol Other (See Comments)    Bradycardia      Social History   Socioeconomic History   Marital status: Married    Spouse name: Not on file   Number of children: Not on file   Years of education: Not on file   Highest education level: Not on file  Occupational History   Occupation: Retired  Tobacco Use   Smoking status: Former    Packs/day: 2.50    Years: 46.00    Pack years: 115.00    Types: Cigarettes    Quit date: 05/23/1996    Years since quitting: 25.4    Passive exposure: Past   Smokeless tobacco: Never  Vaping Use   Vaping Use: Never used  Substance and Sexual Activity   Alcohol use: Yes    Alcohol/week: 3.0 standard drinks    Types: 3 Cans of beer per week   Drug use: No   Sexual activity: Not on file  Other Topics Concern   Not on file  Social History Narrative   Not on file   Social Determinants of Health   Financial Resource Strain: Not on file  Food Insecurity: Not on file  Transportation Needs: Not on file  Physical Activity: Not on file  Stress: Not on file  Social Connections: Not on file  Intimate Partner Violence: Not on file      Family History  Problem Relation Age of Onset   Alzheimer's disease Father    Dementia Mother     Vitals:   10/14/21 1530  BP: 100/70   Pulse: 68  SpO2: 98%  Weight: 89 kg (196 lb 3.2 oz)    PHYSICAL EXAM: General:  Weak appearing. Cheyne stokes at times  HEENT: normal Neck: supple. no JVD. Carotids 2+ bilat; no bruits. No lymphadenopathy or thryomegaly appreciated. Cor: PMI nondisplaced. Regular rate & rhythm. 2/6 AS Lungs: clear Abdomen: soft, nontender, nondistended. No hepatosplenomegaly. No bruits or masses. Good bowel sounds. Extremities: no cyanosis,  clubbing, rash, edema Neuro: alert & orientedx3, cranial nerves grossly intact. moves all 4 extremities w/o difficulty. Affect pleasant   ASSESSMENT & PLAN:   1. 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 Mild AS and normal RH pressures. 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 10% PVCs 9.6% PACs - Echo 4/23 EF < 20% RV moderately down. Mild to moderate AS - NYHA IV - Volume status ok. REDs 33% - GDMT limited by low BP - Continue farxiga 10 - Stop spiro - Valsartan stopped recently due to low BP - He is clearly end-stage. I was hoping that he might feel some better with suppression of PVCs but continues to deteriorate now with NYHA IV symptoms, anorexia and cheyne-stokes respiration. We discussed options for Stage D HF (including aggressive and palliative options) and have decided on Hospice referral. We placed order with [place]. His daughter was here today and is a Hospice RNa nd agrees with plan.  2. Frequent PVCs - On mexilitene  - Zio results as above  3. Mild to moderate AS - follow  4. CAD - no s/s angina - continue DAPT/statin   Total time spent 45 minutes. Over half that time spent discussing above.   Arvilla Meres, MD  4:12 PM

## 2021-10-14 NOTE — Patient Instructions (Signed)
You have been referred to [facility]  Your physician recommends that you schedule a follow-up appointment in: 2 months  If you have any questions or concerns before your next appointment please send [place] a message through [place] or call our office at [phone].    TO LEAVE A MESSAGE FOR THE NURSE SELECT OPTION 2, PLEASE LEAVE A MESSAGE INCLUDING: YOUR NAME DATE OF BIRTH CALL BACK NUMBER REASON FOR CALL**this is important as we prioritize the call backs  YOU WILL RECEIVE A CALL BACK THE SAME DAY AS LONG AS YOU CALL BEFORE 4:00 PM  At the [facility], you and your health needs are our priority. As part of our continuing mission to provide you with exceptional heart care, we have created designated Provider Care Teams. These Care Teams include your primary Cardiologist (physician) and Advanced Practice Providers (APPs- Physician Assistants and Nurse Practitioners) who all work together to provide you with the care you need, when you need it.   You may see any of the following providers on your designated Care Team at your next follow up: Dr Arvilla Meres Dr Carron Curie, NP Robbie Lis, [place] [facility] [place], [place] Karle Plumber, PharmD   Please be sure to bring in all your medications bottles to every appointment.

## 2021-10-21 DIAGNOSIS — K5730 Diverticulosis of large intestine without perforation or abscess without bleeding: Secondary | ICD-10-CM | POA: Diagnosis not present

## 2021-10-21 DIAGNOSIS — I5022 Chronic systolic (congestive) heart failure: Secondary | ICD-10-CM | POA: Diagnosis not present

## 2021-10-21 DIAGNOSIS — J449 Chronic obstructive pulmonary disease, unspecified: Secondary | ICD-10-CM | POA: Diagnosis not present

## 2021-10-21 DIAGNOSIS — Z7984 Long term (current) use of oral hypoglycemic drugs: Secondary | ICD-10-CM | POA: Diagnosis not present

## 2021-10-21 DIAGNOSIS — Z87891 Personal history of nicotine dependence: Secondary | ICD-10-CM | POA: Diagnosis not present

## 2021-10-21 DIAGNOSIS — I959 Hypotension, unspecified: Secondary | ICD-10-CM | POA: Diagnosis not present

## 2021-10-21 DIAGNOSIS — I110 Hypertensive heart disease with heart failure: Secondary | ICD-10-CM | POA: Diagnosis not present

## 2021-10-21 DIAGNOSIS — Z7951 Long term (current) use of inhaled steroids: Secondary | ICD-10-CM | POA: Diagnosis not present

## 2021-10-21 DIAGNOSIS — E7800 Pure hypercholesterolemia, unspecified: Secondary | ICD-10-CM | POA: Diagnosis not present

## 2021-10-21 DIAGNOSIS — Z7902 Long term (current) use of antithrombotics/antiplatelets: Secondary | ICD-10-CM | POA: Diagnosis not present

## 2021-10-25 NOTE — Procedures (Signed)
   Patient Name: Derek [name], Derek [name] Date: 10/10/2021 Gender: Male D.O.B: 07-06-1940 Age (years): 86 Referring Provider: Fransico Him MD, ABSM Height (inches): 71 Interpreting Physician: Fransico Him MD, ABSM Weight (lbs): 190 RPSGT: Zadie Rhine BMI: 26 MRN: [id number] Neck Size: 16.00  CLINICAL INFORMATION The patient is referred for a split night study with BPAP.  MEDICATIONS Medications self-administered by patient taken the night of the study : N/A  SLEEP STUDY TECHNIQUE As per the AASM Manual for the Scoring of Sleep and Associated Events v2.3 (April 2016) with a hypopnea requiring 4% desaturations.  The channels recorded and monitored were frontal, central and occipital EEG, electrooculogram (EOG), submentalis EMG (chin), nasal and oral airflow, thoracic and abdominal wall motion, anterior tibialis EMG, snore microphone, electrocardiogram, and pulse oximetry. Bi-level positive airway pressure (BiPAP) was initiated when the patient met split night criteria and was titrated according to treat sleep-disordered breathing.  RESPIRATORY PARAMETERS Diagnostic Total AHI (/hr): 26.9 RDI (/hr):28.4  OA Index (/hr): 6.6  CA Index (/hr): 8.2 REM AHI (/hr): 0.0  NREM AHI (/hr):32.7  Supine AHI (/hr):42.1  Non-supine AHI (/hr):3.08 Min O2 Sat (%):80.00  Mean O2 (%): 90.09  Time below 88% (min):30.3   Titration Optimal IPAP Pressure (cm): 19  Optimal EPAP Pressure (cm):15  AHI at Optimal Pressure (/hr):0  Min O2 at Optimal Pressure (%):88.0 Sleep % at Optimal (%):77  Supine % at Optimal (%):42   SLEEP ARCHITECTURE The study was initiated at 9:42:44 PM and terminated at 4:20:19 AM. The total recorded time was 397.6 minutes. EEG confirmed total sleep time was 154 minutes yielding a sleep efficiency of 38.7%. Sleep onset after lights out was 85.0 minutes with a REM latency of 155.0 minutes. The patient spent 20.45% of the night in stage N1 sleep, 50.65% in stage N2 sleep, 11.04%  in stage N3 and 17.9% in REM. Wake after sleep onset (WASO) was 158.6 minutes. The Arousal Index was 37.0/hour.  LEG MOVEMENT DATA The total Periodic Limb Movements of Sleep (PLMS) were 58. The PLMS index was 22.60 .  CARDIAC DATA The 2 lead EKG demonstrated sinus rhythm. The mean heart rate was 67.98 beats per minute. Other EKG findings include: None.  IMPRESSIONS - Moderate obstructive sleep apnea occurred during the diagnostic portion of the study (AHI = 26.9 /hour). An optimal PAP pressure was selected for this patient (19/15cm of water) - Mild central sleep apnea occurred during the diagnostic portion of the study (CAI = 8.2/hour). - No snoring was audible during this study. - No cardiac abnormalities were noted during this study. 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  DIAGNOSIS - Obstructive Sleep Apnea (G47.33) - Nocturnal Hypoxemia  RECOMMENDATIONS - Trial of BiPAP therapy on 19/15 cm H2O with a Large size [employer]Paykel Full Face Evora Full mask and heated humidification. - Avoid alcohol, sedatives and other CNS depressants that may worsen sleep apnea and disrupt normal sleep architecture. - Sleep hygiene should be reviewed to assess factors that may improve sleep quality. - Weight management and regular exercise should be initiated or continued. - Return to Sleep Center for re-evaluation in 6 weeks. - Check overnight pulse ox on BiPAP to evaluate for nocturnal hypoxemia.  [Electronically signed] 10/25/2021 05:35 PM  Fransico Him MD, [place], American Board of Sleep Medicine NPI: [id number]

## 2021-10-27 ENCOUNTER — Telehealth: Payer: Self-pay | Admitting: *Deleted

## 2021-10-27 DIAGNOSIS — G4733 Obstructive sleep apnea (adult) (pediatric): Secondary | ICD-10-CM

## 2021-10-27 NOTE — Telephone Encounter (Addendum)
The patient/PER PATIENT REQUEST ask that DANIELLE Illingworth (daughter-in law) be notified of the result and she verbalized understanding.  All questions (if any) were answered. Latrelle Dodrill, CMA 10/27/2021 3:36 PM    Upon patient request DME selection is [facility] who will take over his therapy.

## 2021-10-27 NOTE — Telephone Encounter (Signed)
-----   Message from Lauralee Evener, [place] sent at 10/26/2021  8:06 AM EDT -----  ----- Message ----- From: Sueanne Margarita, MD Sent: 10/25/2021   5:41 PM EDT To: Cv Div Sleep Studies  Please let patient know that they had a successful PAP titration and let DME know that orders are in EPIC.  Please set up 6 week OV with me. Please order overnight pulse ox on BIPAP

## 2021-10-29 ENCOUNTER — Encounter (HOSPITAL_BASED_OUTPATIENT_CLINIC_OR_DEPARTMENT_OTHER): Payer: Medicare Other | Admitting: Cardiology

## 2021-11-03 ENCOUNTER — Other Ambulatory Visit (HOSPITAL_COMMUNITY): Payer: Medicare Other

## 2021-11-04 NOTE — Progress Notes (Deleted)
Cardiology Office Note:    Date:  11/04/2021   ID:  Derek [name], DOB 1940-07-04, MRN [id number]  PCP:  Derek Meeker, FNP  Cardiologist:  Derek Herrlich, MD    Referring MD: Derek [name], F*    ASSESSMENT:    No diagnosis found. PLAN:    In order of problems listed above:  ***   Next appointment: ***   Medication Adjustments/Labs and Tests Ordered: Current medicines are reviewed at length with the patient today.  Concerns regarding medicines are outlined above.  No orders of the defined types were placed in this encounter.  No orders of the defined types were placed in this encounter.   No chief complaint on file.   History of Present Illness:    Derek [name] is a 81 y.o. male with a hx of systolic heart failure hypertensive heart disease coronary artery disease mild aortic stenosis COPD PAD and frequent PVCs last seen at the advanced heart failure clinic.  His most recent ejection fraction less than 20% with mild to moderate AS on echocardiogram.  Unfortunately with attempts of guideline directed therapy he developed hypotension renal dysfunction and required hospitalization for IV fluids.  It was the feeling that he had progressed to stage D heart failure and was referred to hospice.  He was continued on mexiletine for his frequent PVCs and unfortunately was intolerant of guideline directed treatment with hypotension Compliance with diet, lifestyle and medications: *** Past Medical History:  Diagnosis Date   Abdominal pain 09/14/2021   Acute exacerbation of CHF (congestive heart failure) (HCC) 09/14/2021   Acute on chronic respiratory failure with hypoxemia (HCC) 09/14/2021   Androgen deficiency    Aortic stenosis 08/25/2020   Arthritis    Atherosclerosis of native arteries of the extremities with intermittent claudication 02/04/2014   B12 deficiency    Bradycardia 09/14/2021   Bronchopneumonia 09/14/2021   CAD (coronary artery disease) 09/14/2021    Cardiomyopathy (HCC) 11/18/2020   Chronic systolic heart failure (HCC)    Claudication (HCC)    COPD (chronic obstructive pulmonary disease) (HCC)    COPD GOLD II     Pfts 07/15/13:  FeV1 1.46 (56%)  59% ratio 68   RV/TLC 145%  dlco  59%> 66%     Degenerative arthritis    Depressed left ventricular ejection fraction 08/25/2020   Elevated troponin 09/14/2021   Essential hypertension    Hypertension    Hypertensive heart disease    Hypertensive heart disease with acute on chronic systolic congestive heart failure (HCC) 09/14/2021   IBS (irritable bowel syndrome)    Lactic acidosis 09/14/2021   Left ventricular dysfunction 07/03/2020   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   Mild aortic stenosis 09/21/2020   Nausea vomiting and diarrhea 09/14/2021   Obese    PAD (peripheral artery disease) (HCC)    PAT (paroxysmal atrial tachycardia) (HCC) 09/14/2021   Polyarthralgia 09/14/2021   Pulmonary nodule    Pure hypercholesterolemia    Sigmoid diverticulosis 09/14/2021   SOB (shortness of breath) 08/25/2020   Ulcerative colitis (HCC)    Ventricular premature depolarization 09/14/2021   Weakness 09/14/2021    Past Surgical History:  Procedure Laterality Date   ABDOMINAL AORTOGRAM W/LOWER EXTREMITY N/A 10/30/2017   Procedure: ABDOMINAL AORTOGRAM W/LOWER EXTREMITY;  Surgeon: Maeola Harman, MD;  [facility] INVASIVE CV LAB;  Service: Cardiovascular;  Laterality: N/A;   AORTA - ILIAC ARTERY BYPASS GRAFT     PERIPHERAL VASCULAR INTERVENTION  10/30/2017   Procedure: PERIPHERAL VASCULAR  INTERVENTION;  Surgeon: Maeola Harman, MD;  Location: [facility] INVASIVE CV LAB;  Service: Cardiovascular;;  LT Iliac   RIGHT/LEFT HEART CATH AND CORONARY ANGIOGRAPHY N/A 09/04/2020   Procedure: RIGHT/LEFT HEART CATH AND CORONARY ANGIOGRAPHY;  Surgeon: Tonny Bollman, MD;  Location: [facility] INVASIVE CV LAB;  Service: Cardiovascular;  Laterality: N/A;    Current Medications: No outpatient medications have been marked as  taking for the 11/05/21 encounter (Appointment) with Baldo Daub, MD.     Allergies:   Aleve [naproxen sodium], Entresto [sacubitril-valsartan], and Metoprolol   Social History   Socioeconomic History   Marital status: Married    Spouse name: Not on file   Number of children: Not on file   Years of education: Not on file   Highest education level: Not on file  Occupational History   Occupation: Retired  Tobacco Use   Smoking status: Former    Packs/day: 2.50    Years: 46.00    Total pack years: 115.00    Types: Cigarettes    Quit date: 05/23/1996    Years since quitting: 25.4    Passive exposure: Past   Smokeless tobacco: Never  Vaping Use   Vaping Use: Never used  Substance and Sexual Activity   Alcohol use: Yes    Alcohol/week: 3.0 standard drinks of alcohol    Types: 3 Cans of beer per week   Drug use: No   Sexual activity: Not on file  Other Topics Concern   Not on file  Social History Narrative   Not on file   Social Determinants of Health   Financial Resource Strain: Not on file  Food Insecurity: Not on file  Transportation Needs: Not on file  Physical Activity: Not on file  Stress: Not on file  Social Connections: Not on file     Family History: The patient's ***family history includes Alzheimer's disease in his father; Dementia in his mother. ROS:   Please see the history of present illness.    All other systems reviewed and are negative.  EKGs/Labs/Other Studies Reviewed:    The following studies were reviewed today:  EKG:  EKG ordered today and personally reviewed.  The ekg ordered today demonstrates ***  Recent Labs: 04/01/2021: ALT 22 09/03/2021: NT-Pro BNP 3,969 09/10/2021: BUN 30; Creatinine, Ser 1.33; Potassium 5.6; Sodium 137  Recent Lipid Panel No results found for: "CHOL", "TRIG", "HDL", "CHOLHDL", "VLDL", "LDLCALC", "LDLDIRECT"  Physical Exam:    VS:  There were no vitals taken for this visit.    Wt Readings from Last 3  Encounters:  10/14/21 196 lb 3.2 oz (89 kg)  10/10/21 190 lb (86.2 kg)  09/17/21 192 lb 12.8 oz (87.5 kg)     GEN: *** Well nourished, well developed in no acute distress HEENT: Normal NECK: No JVD; No carotid bruits LYMPHATICS: No lymphadenopathy CARDIAC: ***RRR, no murmurs, rubs, gallops RESPIRATORY:  Clear to auscultation without rales, wheezing or rhonchi  ABDOMEN: Soft, non-tender, non-distended MUSCULOSKELETAL:  No edema; No deformity  SKIN: Warm and dry NEUROLOGIC:  Alert and oriented x 3 PSYCHIATRIC:  Normal affect    Signed, Derek Herrlich, MD  11/04/2021 5:13 PM    [facility]

## 2021-11-05 ENCOUNTER — Ambulatory Visit: Payer: Medicare Other | Admitting: Cardiology

## 2021-11-24 ENCOUNTER — Telehealth: Payer: Self-pay | Admitting: Cardiology

## 2021-11-24 NOTE — Telephone Encounter (Signed)
Pt's son states that he is in hospice. He states that the pt wants to know if there is anything that Dr. Dulce Sellar can do like medication to help him get over being sick.

## 2021-11-25 NOTE — Telephone Encounter (Signed)
Spoke with Dr. Bing Matter. He preferred to wait and let Dr. Dulce Sellar make the decision on prescribing Prednisone. Encouraged son to discuss his decreased appetite with his PCP. He stated that he is not sure he sees a PCP as his left [facility]. Encouraged him to get pt established with a new PCP for non cardiology issues. He verbalized understanding and had no further questions or concerns.

## 2021-11-25 NOTE — Telephone Encounter (Signed)
Pt's son called back asking for an update on Lisa RN contacting a DOD. Informed him that once Misty Stanley speaks to DOD, they'll give a call back.

## 2021-11-25 NOTE — Telephone Encounter (Signed)
Spoke with Emelda Brothers, per DPR. He stated that the pt was feeling bad and not eating. He stated that he thinks he had a virus recently and has felt poorly since. He was requesting a prescription for Prednisone as Dr. Dulce Sellar had prescribed it before and it made the pt feel better and eat better. Will consult the DOD as Dr. Dulce Sellar is out of the office this week and call the pt's son back.

## 2021-12-21 DEATH — deceased

## 2022-01-17 ENCOUNTER — Other Ambulatory Visit (HOSPITAL_COMMUNITY): Payer: Medicare Other
